# Patient Record
Sex: Female | Born: 1955 | Race: White | Hispanic: No | Smoking: Never smoker
Health system: Southern US, Community
[De-identification: ages and names within clinical notes are randomized; demographics above are authoritative.]

## PROBLEM LIST (undated history)

## (undated) DIAGNOSIS — F419 Anxiety disorder, unspecified: Secondary | ICD-10-CM

## (undated) DIAGNOSIS — M549 Dorsalgia, unspecified: Secondary | ICD-10-CM

## (undated) DIAGNOSIS — M1990 Unspecified osteoarthritis, unspecified site: Secondary | ICD-10-CM

## (undated) DIAGNOSIS — I10 Essential (primary) hypertension: Secondary | ICD-10-CM

## (undated) DIAGNOSIS — Z972 Presence of dental prosthetic device (complete) (partial): Secondary | ICD-10-CM

## (undated) DIAGNOSIS — G8929 Other chronic pain: Secondary | ICD-10-CM

## (undated) HISTORY — PX: BREAST EXCISIONAL BIOPSY: SUR124

## (undated) HISTORY — PX: OTHER SURGICAL HISTORY: SHX169

## (undated) HISTORY — PX: BREAST BIOPSY: SHX20

## (undated) HISTORY — PX: ABDOMINAL HYSTERECTOMY: SHX81

## (undated) HISTORY — PX: BACK SURGERY: SHX140

---

## 2004-05-19 ENCOUNTER — Ambulatory Visit: Payer: Self-pay | Admitting: Obstetrics and Gynecology

## 2004-05-31 ENCOUNTER — Ambulatory Visit: Payer: Self-pay | Admitting: Obstetrics and Gynecology

## 2005-01-05 ENCOUNTER — Ambulatory Visit: Payer: Self-pay | Admitting: General Surgery

## 2006-02-28 ENCOUNTER — Ambulatory Visit: Payer: Self-pay | Admitting: Family Medicine

## 2007-03-13 ENCOUNTER — Ambulatory Visit: Payer: Self-pay | Admitting: Family Medicine

## 2007-10-08 ENCOUNTER — Other Ambulatory Visit: Payer: Self-pay

## 2007-10-08 ENCOUNTER — Emergency Department: Payer: Self-pay | Admitting: Emergency Medicine

## 2007-10-09 ENCOUNTER — Ambulatory Visit: Payer: Self-pay | Admitting: Cardiology

## 2008-05-08 ENCOUNTER — Ambulatory Visit: Payer: Self-pay | Admitting: Obstetrics & Gynecology

## 2009-05-29 ENCOUNTER — Ambulatory Visit: Payer: Self-pay | Admitting: Obstetrics & Gynecology

## 2010-07-07 ENCOUNTER — Ambulatory Visit: Payer: Self-pay | Admitting: Obstetrics & Gynecology

## 2010-08-08 ENCOUNTER — Emergency Department (HOSPITAL_COMMUNITY)
Admission: EM | Admit: 2010-08-08 | Discharge: 2010-08-08 | Disposition: A | Discharge status: Home / Self Care | Payer: Medicare Other | Attending: Emergency Medicine | Admitting: Emergency Medicine

## 2010-08-08 DIAGNOSIS — R300 Dysuria: Secondary | ICD-10-CM | POA: Insufficient documentation

## 2010-08-08 DIAGNOSIS — R319 Hematuria, unspecified: Secondary | ICD-10-CM | POA: Insufficient documentation

## 2010-08-08 DIAGNOSIS — N390 Urinary tract infection, site not specified: Secondary | ICD-10-CM | POA: Insufficient documentation

## 2010-08-08 DIAGNOSIS — R350 Frequency of micturition: Secondary | ICD-10-CM | POA: Insufficient documentation

## 2010-08-08 DIAGNOSIS — R3915 Urgency of urination: Secondary | ICD-10-CM | POA: Insufficient documentation

## 2010-08-08 LAB — URINALYSIS, ROUTINE W REFLEX MICROSCOPIC
Ketones, ur: 15 mg/dL — AB
Nitrite: POSITIVE — AB
Urobilinogen, UA: 2 mg/dL — ABNORMAL HIGH (ref 0.0–1.0)
pH: 5 (ref 5.0–8.0)

## 2010-08-08 LAB — URINE MICROSCOPIC-ADD ON

## 2010-08-09 LAB — URINE CULTURE
Colony Count: 90000
Culture  Setup Time: 201205070011

## 2010-08-12 ENCOUNTER — Emergency Department: Payer: Self-pay | Admitting: Emergency Medicine

## 2010-08-16 ENCOUNTER — Emergency Department (HOSPITAL_COMMUNITY)
Admission: EM | Admit: 2010-08-16 | Discharge: 2010-08-16 | Disposition: A | Discharge status: Home / Self Care | Payer: Medicare Other | Attending: Emergency Medicine | Admitting: Emergency Medicine

## 2010-08-16 DIAGNOSIS — N899 Noninflammatory disorder of vagina, unspecified: Secondary | ICD-10-CM | POA: Insufficient documentation

## 2010-08-16 DIAGNOSIS — N760 Acute vaginitis: Secondary | ICD-10-CM | POA: Insufficient documentation

## 2010-08-16 DIAGNOSIS — R51 Headache: Secondary | ICD-10-CM | POA: Insufficient documentation

## 2010-08-16 DIAGNOSIS — Z79899 Other long term (current) drug therapy: Secondary | ICD-10-CM | POA: Insufficient documentation

## 2010-08-16 DIAGNOSIS — R112 Nausea with vomiting, unspecified: Secondary | ICD-10-CM | POA: Insufficient documentation

## 2010-08-16 DIAGNOSIS — N3090 Cystitis, unspecified without hematuria: Secondary | ICD-10-CM | POA: Insufficient documentation

## 2010-08-16 LAB — URINALYSIS, ROUTINE W REFLEX MICROSCOPIC
Ketones, ur: NEGATIVE mg/dL
Nitrite: POSITIVE — AB
Protein, ur: NEGATIVE mg/dL
Urobilinogen, UA: 1 mg/dL (ref 0.0–1.0)
pH: 6 (ref 5.0–8.0)

## 2010-08-16 LAB — DIFFERENTIAL
Basophils Absolute: 0.1 10*3/uL (ref 0.0–0.1)
Eosinophils Relative: 1 % (ref 0–5)
Lymphocytes Relative: 29 % (ref 12–46)
Lymphs Abs: 2.7 10*3/uL (ref 0.7–4.0)
Monocytes Relative: 10 % (ref 3–12)
Neutro Abs: 5.5 10*3/uL (ref 1.7–7.7)

## 2010-08-16 LAB — POCT I-STAT, CHEM 8
BUN: 7 mg/dL (ref 6–23)
Chloride: 95 mEq/L — ABNORMAL LOW (ref 96–112)
Creatinine, Ser: 0.8 mg/dL (ref 0.4–1.2)
Glucose, Bld: 108 mg/dL — ABNORMAL HIGH (ref 70–99)
Potassium: 3 mEq/L — ABNORMAL LOW (ref 3.5–5.1)
Sodium: 138 mEq/L (ref 135–145)

## 2010-08-16 LAB — WET PREP, GENITAL: Clue Cells Wet Prep HPF POC: NONE SEEN

## 2010-08-16 LAB — CBC
HCT: 36.1 % (ref 36.0–46.0)
Hemoglobin: 12.4 g/dL (ref 12.0–15.0)
MCH: 29.8 pg (ref 26.0–34.0)
MCHC: 34.3 g/dL (ref 30.0–36.0)
RBC: 4.16 MIL/uL (ref 3.87–5.11)

## 2010-08-17 LAB — URINE CULTURE
Colony Count: NO GROWTH
Culture: NO GROWTH

## 2011-12-04 ENCOUNTER — Emergency Department (HOSPITAL_COMMUNITY)
Admission: EM | Admit: 2011-12-04 | Discharge: 2011-12-04 | Disposition: A | Discharge status: Home / Self Care | Payer: Medicare Other | Attending: Emergency Medicine | Admitting: Emergency Medicine

## 2011-12-04 ENCOUNTER — Encounter (HOSPITAL_COMMUNITY): Payer: Self-pay | Admitting: Nurse Practitioner

## 2011-12-04 DIAGNOSIS — B349 Viral infection, unspecified: Secondary | ICD-10-CM

## 2011-12-04 DIAGNOSIS — R110 Nausea: Secondary | ICD-10-CM | POA: Insufficient documentation

## 2011-12-04 DIAGNOSIS — R51 Headache: Secondary | ICD-10-CM | POA: Insufficient documentation

## 2011-12-04 DIAGNOSIS — B9789 Other viral agents as the cause of diseases classified elsewhere: Secondary | ICD-10-CM | POA: Insufficient documentation

## 2011-12-04 HISTORY — DX: Other chronic pain: G89.29

## 2011-12-04 HISTORY — DX: Dorsalgia, unspecified: M54.9

## 2011-12-04 LAB — CBC WITH DIFFERENTIAL/PLATELET
Basophils Absolute: 0 K/uL (ref 0.0–0.1)
Basophils Relative: 0 % (ref 0–1)
Eosinophils Absolute: 0.1 K/uL (ref 0.0–0.7)
Eosinophils Relative: 1 % (ref 0–5)
HCT: 37.7 % (ref 36.0–46.0)
Hemoglobin: 13.3 g/dL (ref 12.0–15.0)
Lymphocytes Relative: 31 % (ref 12–46)
Lymphs Abs: 2.2 K/uL (ref 0.7–4.0)
MCH: 30.7 pg (ref 26.0–34.0)
MCHC: 35.3 g/dL (ref 30.0–36.0)
MCV: 87.1 fL (ref 78.0–100.0)
Monocytes Absolute: 0.8 K/uL (ref 0.1–1.0)
Monocytes Relative: 12 % (ref 3–12)
Neutro Abs: 3.8 K/uL (ref 1.7–7.7)
Neutrophils Relative %: 55 % (ref 43–77)
Platelets: 238 K/uL (ref 150–400)
RBC: 4.33 MIL/uL (ref 3.87–5.11)
RDW: 12.2 % (ref 11.5–15.5)
WBC: 7 K/uL (ref 4.0–10.5)

## 2011-12-04 LAB — URINALYSIS, ROUTINE W REFLEX MICROSCOPIC
Bilirubin Urine: NEGATIVE
Glucose, UA: NEGATIVE mg/dL
Ketones, ur: NEGATIVE mg/dL
Nitrite: NEGATIVE
Protein, ur: NEGATIVE mg/dL
Specific Gravity, Urine: 1.021 (ref 1.005–1.030)
Urobilinogen, UA: 0.2 mg/dL (ref 0.0–1.0)
pH: 5.5 (ref 5.0–8.0)

## 2011-12-04 LAB — BASIC METABOLIC PANEL WITH GFR
BUN: 8 mg/dL (ref 6–23)
CO2: 30 meq/L (ref 19–32)
Calcium: 9.4 mg/dL (ref 8.4–10.5)
Chloride: 102 meq/L (ref 96–112)
Creatinine, Ser: 0.78 mg/dL (ref 0.50–1.10)
GFR calc Af Amer: >90 mL/min
GFR calc non Af Amer: >90 mL/min
Glucose, Bld: 107 mg/dL — ABNORMAL HIGH (ref 70–99)
Potassium: 3.5 meq/L (ref 3.5–5.1)
Sodium: 139 meq/L (ref 135–145)

## 2011-12-04 LAB — URINE MICROSCOPIC-ADD ON

## 2011-12-04 MED ORDER — IBUPROFEN 400 MG PO TABS: 400.0000 mg | ORAL_TABLET | Freq: Four times a day (QID) | ORAL | Status: AC | PRN

## 2011-12-04 NOTE — ED Provider Notes (Addendum)
History   This chart was scribed for Derwood Kaplan, MD by Melba Coon. The patient was seen in room TR04C/TR04C and the patient's care was started at 12:57PM.    CSN: [id number]  Arrival date & time 12/04/11  1131   First MD Initiated Contact with Patient 12/04/11 1247      Chief Complaint  Patient presents with  . Headache    (Consider location/radiation/quality/duration/timing/severity/associated sxs/prior treatment) The history is provided by the patient. No language interpreter was used.   Sylvia Cole is a 56 y.o. female who presents to the Emergency Department complaining of constant, moderate to severe headache with associated nausea and fever around 100 with an onset 2:00AM 2 days ago. Pt states that the HA woke her up in her sleep. Left sided back pain has also started; pt takes methadone 30mg /day which alleviates the back pain. No BM in the last 2 days. Decreased appetite compared to baseline. No tick bites or outdoor activities. Baseline right-sided back pain present. Malaise and fatigue present. No neck pain, sore throat, rash, CP, SOB, abd pain, vomit, diarrhea, dysuria, urinary or bowel dysfunction, or extremity edema, weakness, numbness, or tingling. No other pertinent medical symptoms.   Past Medical History  Diagnosis Date  . Chronic back pain     Past Surgical History  Procedure Date  . Back surgery   . Abdominal hysterectomy     History reviewed. No pertinent family history.  History  Substance Use Topics  . Smoking status: Never Smoker   . Smokeless tobacco: Not on file  . Alcohol Use: Yes     rare    OB History    Grav Para Term Preterm Abortions TAB SAB Ect Mult Living                  Review of Systems 10 Systems reviewed and all are negative for acute change except as noted in the HPI.   Allergies  Bactrim; Ciprofloxacin; and Celebrex  Home Medications   Current Outpatient Rx  Name Route Sig Dispense Refill  . METHADONE HCL 5  MG PO TABS Oral Take 7.5 mg by mouth 4 (four) times daily.      BP 134/76  Pulse 64  Temp 98.8 F (37.1 C) (Oral)  Resp 15  SpO2 96%  Physical Exam  Nursing note and vitals reviewed. Constitutional: She is oriented to person, place, and time. She appears well-developed and well-nourished.  HENT:  Head: Normocephalic and atraumatic.  Eyes: EOM are normal. Pupils are equal, round, and reactive to light.       Pupils are 2mm equally and no nystagmus.  Neck: Normal range of motion. Neck supple.  Cardiovascular: Normal rate, normal heart sounds and intact distal pulses.   No murmur heard. Pulmonary/Chest: Effort normal and breath sounds normal. No respiratory distress. She has no wheezes.  Abdominal: Soft. Bowel sounds are normal. She exhibits no distension. There is no tenderness.  Musculoskeletal: Normal range of motion. She exhibits tenderness (right CVA tenderness that is reproducible with palpation that radiates to the hip). She exhibits no edema.  Lymphadenopathy:    She has no cervical adenopathy.  Neurological: She is alert and oriented to person, place, and time. She has normal strength. No cranial nerve deficit or sensory deficit.       Cranial nerves II-XII intact with no meningeal signs.  Skin: Skin is warm and dry. No rash noted.  Psychiatric: She has a normal mood and affect.    ED  Course  Procedures (including critical care time)  DIAGNOSTIC STUDIES: Oxygen Saturation is 96% on room air, adequate by my interpretation.    COORDINATION OF CARE:  1:04PM - blood w/u and UA will be ordered for the pt.   Labs Reviewed  BASIC METABOLIC PANEL - Abnormal; Notable for the following:    Glucose, Bld 107 (*)     All other components within normal limits  URINALYSIS, ROUTINE W REFLEX MICROSCOPIC - Abnormal; Notable for the following:    Hgb urine dipstick TRACE (*)     Leukocytes, UA TRACE (*)     All other components within normal limits  CBC WITH DIFFERENTIAL  URINE  MICROSCOPIC-ADD ON   No results found.   No diagnosis found.    MDM  56 y/o healthy woman comes in with cc of maxillary headaches, myalgias, malaise, back pain, low grade fevers No UTI like sx. + nausea, but no vomiting, visual complains, seizures, altered mental status, loss of consciousness, new weakness, or numbness, no gait instability. No tick bites, no exposures to wilderness.  Likely viral syndrome. Will get basic labs only.   Medical screening examination/treatment/procedure(s) were performed by me as the supervising physician. Scribe service was utilized for documentation only.     Derwood Kaplan, MD 12/04/11 1440  Derwood Kaplan, MD 12/04/11 1440

## 2011-12-04 NOTE — ED Notes (Signed)
C/o fatigue, headache, nausea and low fever since friday

## 2012-01-31 ENCOUNTER — Emergency Department: Payer: Self-pay | Admitting: Emergency Medicine

## 2015-02-12 ENCOUNTER — Encounter: Payer: Self-pay | Admitting: General Surgery

## 2015-02-12 ENCOUNTER — Ambulatory Visit (INDEPENDENT_AMBULATORY_CARE_PROVIDER_SITE_OTHER): Payer: Medicare Other | Admitting: General Surgery

## 2015-02-12 VITALS — BP 130/74 | HR 76 | Resp 12 | Ht <= 58 in | Wt 147.0 lb

## 2015-02-12 DIAGNOSIS — Z1211 Encounter for screening for malignant neoplasm of colon: Secondary | ICD-10-CM | POA: Diagnosis not present

## 2015-02-12 MED ORDER — POLYETHYLENE GLYCOL 3350 17 GM/SCOOP PO POWD: ORAL | Status: DC

## 2015-02-12 MED ORDER — METOCLOPRAMIDE HCL 10 MG PO TABS: 10.0000 mg | ORAL_TABLET | ORAL | Status: DC

## 2015-02-12 NOTE — Progress Notes (Signed)
Patient ID: Sylvia Cole, female   DOB: 1955/12/29, 59 y.o.   MRN: [id number]  Chief Complaint  Patient presents with  . Colonoscopy    HPI Sylvia Cole is a 59 y.o. female here today for a evaluation of a screening colonoscopy. No GI problems at this time. Patient states her husband has gone on to develop stage IV colon cancer. Patient moves her bowls every other day. Chronic constipation secondary to necrotic use for back pain. No history of change in bowel habits, blood or mucus per rectum. HPI  Past Medical History  Diagnosis Date  . Chronic back pain     Past Surgical History  Procedure Laterality Date  . Back surgery    . Abdominal hysterectomy      Family History  Problem Relation Age of Onset  . Breast cancer Mother     Social History Social History  Substance Use Topics  . Smoking status: Never Smoker   . Smokeless tobacco: None  . Alcohol Use: Yes     Comment: rare    Allergies  Allergen Reactions  . Bactrim [Sulfamethoxazole-Trimethoprim] Other (See Comments)    headache  . Ciprofloxacin Other (See Comments)    headache  . Celebrex [Celecoxib] Rash    Current Outpatient Prescriptions  Medication Sig Dispense Refill  . cyclobenzaprine (FLEXERIL) 10 MG tablet Take 10 mg by mouth 3 (three) times daily.     . methadone (DOLOPHINE) 5 MG tablet Take 5 mg by mouth daily.     . sertraline (ZOLOFT) 50 MG tablet Take 50 mg by mouth daily.     . traZODone (DESYREL) 50 MG tablet Take 50 mg by mouth at bedtime.    Marland Kitchen venlafaxine XR (EFFEXOR-XR) 75 MG 24 hr capsule Take 75 mg by mouth daily with breakfast.     . metoCLOPramide (REGLAN) 10 MG tablet Take 1 tablet (10 mg total) by mouth as directed. 1 tabs 30 minutes before beginning prep and 4 hr later if needed for nausea. 2 tablet 0  . polyethylene glycol powder (GLYCOLAX/MIRALAX) powder 255 grams one bottle for colonoscopy prep 255 g 0   No current facility-administered medications for this visit.    Review  of Systems Review of Systems  Constitutional: Negative.   Respiratory: Negative.   Cardiovascular: Negative.     Blood pressure 130/74, pulse 76, resp. rate 12, height 4\' 10"  (1.473 m), weight 147 lb (66.679 kg).  Physical Exam Physical Exam  Constitutional: She is oriented to person, place, and time. She appears well-developed and well-nourished.  HENT:  Mouth/Throat: Oropharynx is clear and moist.  Eyes: Conjunctivae are normal. No scleral icterus.  Neck: Neck supple.  Cardiovascular: Normal rate, regular rhythm and normal heart sounds.   Pulmonary/Chest: Effort normal and breath sounds normal.  Lymphadenopathy:    She has no cervical adenopathy.  Neurological: She is alert and oriented to person, place, and time.  Skin: Skin is warm and dry.  Psychiatric: Her behavior is normal.    Data Reviewed  PCP notes dated 01/07/2015 note patient made that she had been referred for colonoscopy several times.  Assessment    Candidate for screening colonoscopy.     Plan    The patient was very concerned about being nauseated from the prep. Emphasized that she can choose what she would like to mix the MiraLAX in, and this should make the nausea much less troublesome. We'll have her make use of Reglan 30 minutes before and 4 hours later if  needed to minimize nausea.     Her husband suffered an intracranial bleed after a fall and while these may good recover from a stroke he has no seizures. She may need to cancel if he is hospitalized.  Colonoscopy with possible biopsy/polypectomy prn: Information regarding the procedure, including its potential risks and complications (including but not limited to perforation of the bowel, which may require emergency surgery to repair, and bleeding) was verbally given to the patient. Educational information regarding lower intestinal endoscopy was given to the patient. Written instructions for how to complete the bowel prep using Miralax were provided.  The importance of drinking ample fluids to avoid dehydration as a result of the prep emphasized.  Patient has been scheduled for a colonoscopy on 04-14-15 at [facility].  PCP:  Timoteo Gaul 02/13/2015, 5:35 AM

## 2015-02-12 NOTE — Patient Instructions (Addendum)
Colonoscopy A colonoscopy is an exam to look at the entire large intestine (colon). This exam can help find problems such as tumors, polyps, inflammation, and areas of bleeding. The exam takes about 1 hour.  LET [facility] CARE PROVIDER KNOW ABOUT:   Any allergies you have.  All medicines you are taking, including vitamins, herbs, eye drops, creams, and over-the-counter medicines.  Previous problems you or members of your family have had with the use of anesthetics.  Any blood disorders you have.  Previous surgeries you have had.  Medical conditions you have. RISKS AND COMPLICATIONS  Generally, this is a safe procedure. However, as with any procedure, complications can occur. Possible complications include:  Bleeding.  Tearing or rupture of the colon wall.  Reaction to medicines given during the exam.  Infection (rare). BEFORE THE PROCEDURE   Ask your health care provider about changing or stopping your regular medicines.  You may be prescribed an oral bowel prep. This involves drinking a large amount of medicated liquid, starting the day before your procedure. The liquid will cause you to have multiple loose stools until your stool is almost clear or light green. This cleans out your colon in preparation for the procedure.  Do not eat or drink anything else once you have started the bowel prep, unless your health care provider tells you it is safe to do so.  Arrange for someone to drive you home after the procedure. PROCEDURE   You will be given medicine to help you relax (sedative).  You will lie on your side with your knees bent.  A long, flexible tube with a light and camera on the end (colonoscope) will be inserted through the rectum and into the colon. The camera sends video back to a computer screen as it moves through the colon. The colonoscope also releases carbon dioxide gas to inflate the colon. This helps your health care provider see the area better.  During  the exam, your health care provider may take a small tissue sample (biopsy) to be examined under a microscope if any abnormalities are found.  The exam is finished when the entire colon has been viewed. AFTER THE PROCEDURE   Do not drive for 24 hours after the exam.  You may have a small amount of blood in your stool.  You may pass moderate amounts of gas and have mild abdominal cramping or bloating. This is caused by the gas used to inflate your colon during the exam.  Ask when your test results will be ready and how you will get your results. Make sure you get your test results.   This information is not intended to replace advice given to you by your health care provider. Make sure you discuss any questions you have with your health care provider.   Document Released: 03/18/2000 Document Revised: 01/09/2013 Document Reviewed: 11/26/2012 Elsevier Interactive Patient Education [employer].  Patient has been scheduled for a colonoscopy on 04-14-15 at [facility].

## 2015-02-13 ENCOUNTER — Other Ambulatory Visit: Payer: Self-pay | Admitting: General Surgery

## 2015-02-13 DIAGNOSIS — Z1211 Encounter for screening for malignant neoplasm of colon: Secondary | ICD-10-CM

## 2015-04-08 ENCOUNTER — Telehealth: Payer: Self-pay | Admitting: *Deleted

## 2015-04-08 NOTE — Telephone Encounter (Signed)
Patient contacted today and confirms no medication changes since last office visit. She also reports she has picked up Miralax prescription.  We will proceed with colonoscopy as scheduled for 04-14-15 at [facility].  This patient was instructed to call the office should she have further questions.

## 2015-04-14 ENCOUNTER — Encounter
Admission: RE | Disposition: A | Discharge status: Home / Self Care | Payer: Self-pay | Source: Ambulatory Visit | Attending: General Surgery

## 2015-04-14 ENCOUNTER — Ambulatory Visit
Admission: RE | Admit: 2015-04-14 | Discharge: 2015-04-14 | Disposition: A | Discharge status: Home / Self Care | Payer: Medicare Other | Source: Ambulatory Visit | Attending: General Surgery | Admitting: General Surgery

## 2015-04-14 ENCOUNTER — Ambulatory Visit: Payer: Medicare Other | Admitting: Anesthesiology

## 2015-04-14 ENCOUNTER — Encounter: Payer: Self-pay | Admitting: *Deleted

## 2015-04-14 DIAGNOSIS — M549 Dorsalgia, unspecified: Secondary | ICD-10-CM | POA: Insufficient documentation

## 2015-04-14 DIAGNOSIS — Z882 Allergy status to sulfonamides status: Secondary | ICD-10-CM | POA: Diagnosis not present

## 2015-04-14 DIAGNOSIS — Z881 Allergy status to other antibiotic agents status: Secondary | ICD-10-CM | POA: Insufficient documentation

## 2015-04-14 DIAGNOSIS — G8929 Other chronic pain: Secondary | ICD-10-CM | POA: Insufficient documentation

## 2015-04-14 DIAGNOSIS — Z79899 Other long term (current) drug therapy: Secondary | ICD-10-CM | POA: Insufficient documentation

## 2015-04-14 DIAGNOSIS — M1990 Unspecified osteoarthritis, unspecified site: Secondary | ICD-10-CM | POA: Insufficient documentation

## 2015-04-14 DIAGNOSIS — Z1211 Encounter for screening for malignant neoplasm of colon: Secondary | ICD-10-CM

## 2015-04-14 DIAGNOSIS — Z886 Allergy status to analgesic agent status: Secondary | ICD-10-CM | POA: Diagnosis not present

## 2015-04-14 DIAGNOSIS — F419 Anxiety disorder, unspecified: Secondary | ICD-10-CM | POA: Insufficient documentation

## 2015-04-14 HISTORY — DX: Anxiety disorder, unspecified: F41.9

## 2015-04-14 HISTORY — PX: COLONOSCOPY WITH PROPOFOL: SHX5780

## 2015-04-14 HISTORY — DX: Unspecified osteoarthritis, unspecified site: M19.90

## 2015-04-14 SURGERY — COLONOSCOPY WITH PROPOFOL
Anesthesia: General

## 2015-04-14 MED ORDER — MIDAZOLAM HCL 2 MG/2ML IJ SOLN
INTRAMUSCULAR | Status: DC | PRN
  Administered 2015-04-14: 2 mg via INTRAVENOUS

## 2015-04-14 MED ORDER — SODIUM CHLORIDE 0.9 % IV SOLN
INTRAVENOUS | Status: DC
  Administered 2015-04-14: 14:00:00 via INTRAVENOUS

## 2015-04-14 MED ORDER — FENTANYL CITRATE (PF) 100 MCG/2ML IJ SOLN
INTRAMUSCULAR | Status: DC | PRN
  Administered 2015-04-14 (×2): 50 ug via INTRAVENOUS

## 2015-04-14 MED ORDER — PHENYLEPHRINE HCL 10 MG/ML IJ SOLN
INTRAMUSCULAR | Status: DC | PRN
  Administered 2015-04-14: 100 ug via INTRAVENOUS

## 2015-04-14 MED ORDER — PROPOFOL 500 MG/50ML IV EMUL
INTRAVENOUS | Status: DC | PRN
  Administered 2015-04-14: 100 ug/kg/min via INTRAVENOUS

## 2015-04-14 MED ORDER — PROPOFOL 10 MG/ML IV BOLUS
INTRAVENOUS | Status: DC | PRN
  Administered 2015-04-14: 100 mg via INTRAVENOUS

## 2015-04-14 NOTE — H&P (Signed)
ANITTA SENICK [id number] 12/21/1955     HPI: Candidate for screening colonoscopy.   Prescriptions prior to admission  Medication Sig Dispense Refill Last Dose  . cyclobenzaprine (FLEXERIL) 10 MG tablet Take 10 mg by mouth 3 (three) times daily.    04/14/2015 at Unknown time  . methadone (DOLOPHINE) 5 MG tablet Take 5 mg by mouth daily.    04/14/2015 at Unknown time  . sertraline (ZOLOFT) 50 MG tablet Take 50 mg by mouth daily.    04/14/2015 at Unknown time  . metoCLOPramide (REGLAN) 10 MG tablet Take 1 tablet (10 mg total) by mouth as directed. 1 tabs 30 minutes before beginning prep and 4 hr later if needed for nausea. 2 tablet 0   . polyethylene glycol powder (GLYCOLAX/MIRALAX) powder 255 grams one bottle for colonoscopy prep 255 g 0   . traZODone (DESYREL) 50 MG tablet Take 50 mg by mouth at bedtime.   Taking  . venlafaxine XR (EFFEXOR-XR) 75 MG 24 hr capsule Take 75 mg by mouth daily with breakfast.    Taking   Allergies  Allergen Reactions  . Bactrim [Sulfamethoxazole-Trimethoprim] Other (See Comments)    headache  . Ciprofloxacin Other (See Comments)    headache  . Celebrex [Celecoxib] Rash   Past Medical History  Diagnosis Date  . Chronic back pain   . Anxiety   . Arthritis    Past Surgical History  Procedure Laterality Date  . Back surgery    . Abdominal hysterectomy     Social History   Social History  . Marital Status: Married    Spouse Name: N/A  . Number of Children: N/A  . Years of Education: N/A   Occupational History  . Not on file.   Social History Main Topics  . Smoking status: Never Smoker   . Smokeless tobacco: Not on file  . Alcohol Use: Yes     Comment: rare  . Drug Use: No  . Sexual Activity: Not on file   Other Topics Concern  . Not on file   Social History Narrative   Social History   Social History Narrative     ROS: Negative.     PE: HEENT: Negative. Lungs: Clear. Cardio: RR. Robert Bellow 04/14/2015   Assessment/Plan:  Proceed with planned endoscopy.

## 2015-04-14 NOTE — Anesthesia Postprocedure Evaluation (Signed)
Anesthesia Post Note  Patient: Sylvia Cole  Procedure(s) Performed: Procedure(s) (LRB): COLONOSCOPY WITH PROPOFOL (N/A)  Patient location during evaluation: Endoscopy Anesthesia Type: General Level of consciousness: awake and alert Pain management: pain level controlled Vital Signs Assessment: post-procedure vital signs reviewed and stable Respiratory status: spontaneous breathing, nonlabored ventilation, respiratory function stable and patient connected to nasal cannula oxygen Cardiovascular status: blood pressure returned to baseline and stable Postop Assessment: no signs of nausea or vomiting Anesthetic complications: no    Last Vitals:  Filed Vitals:   04/14/15 1507 04/14/15 1517  BP: 100/70 116/80  Pulse: 83 81  Temp:    Resp: 15 15    Last Pain: There were no vitals filed for this visit.               Precious Haws Piscitello

## 2015-04-14 NOTE — Op Note (Signed)
[facility] Patient Name: Sylvia Cole Procedure Date: 04/14/2015 2:10 PM MRN: [id number] Account #: [id number] Date of Birth: 1955/10/03 Admit Type: Outpatient Age: 60 Room: [facility] ENDO ROOM 4 Gender: Female Note Status: Finalized Procedure:         Colonoscopy Indications:       Screening for colorectal malignant neoplasm Providers:         Robert Bellow, MD Referring MD:      Youlanda Roys. Lovie Macadamia, MD (Referring MD) Medicines:         Monitored Anesthesia Care Complications:     No immediate complications. Procedure:         Pre-Anesthesia Assessment:                    - Prior to the procedure, a History and Physical was                     performed, and patient medications, allergies and                     sensitivities were reviewed. The patient's tolerance of                     previous anesthesia was reviewed.                    - The risks and benefits of the procedure and the sedation                     options and risks were discussed with the patient. All                     questions were answered and informed consent was obtained.                    After obtaining informed consent, the colonoscope was                     passed under direct vision. Throughout the procedure, the                     patient's blood pressure, pulse, and oxygen saturations                     were monitored continuously. The Colonoscope was                     introduced through the anus and advanced to the the cecum,                     identified by the appendiceal orifice, IC valve and                     transillumination. The colonoscopy was somewhat difficult                     due to significant looping in thee rectosigmoid.                     Successful completion of the procedure was aided by                     changing the patient to a supine position. The patient  tolerated the procedure well. The quality of the  bowel                     preparation was excellent. Findings:      The entire examined colon appeared normal on direct and retroflexion       views. Impression:        - The entire examined colon is normal on direct and                     retroflexion views.                    - No specimens collected. Recommendation:    - Discharge patient to home (via wheelchair).                    - Repeat colonoscopy in 10 years for screening purposes. Procedure Code(s): --- Professional ---                    [phone], Colonoscopy, flexible; diagnostic, including                     collection of specimen(s) by brushing or washing, when                     performed (separate procedure) Diagnosis Code(s): --- Professional ---                    Z12.11, Encounter for screening for malignant neoplasm of                     colon CPT copyright 2014 American Medical Association. All rights reserved. The codes documented in this report are preliminary and upon coder review may  be revised to meet current compliance requirements. Robert Bellow, MD 04/14/2015 2:45:13 PM This report has been signed electronically. Number of Addenda: 0 Note Initiated On: 04/14/2015 2:10 PM Scope Withdrawal Time: 0 hours 10 minutes 14 seconds  Total Procedure Duration: 0 hours 25 minutes 47 seconds       [facility]

## 2015-04-14 NOTE — Anesthesia Preprocedure Evaluation (Signed)
Anesthesia Evaluation  Patient identified by MRN, date of birth, ID band Patient awake    Reviewed: Allergy & Precautions, H&P , NPO status , Patient's Chart, lab work & pertinent test results  History of Anesthesia Complications Negative for: history of anesthetic complications  Airway Mallampati: III  TM Distance: >3 FB Neck ROM: limited    Dental no notable dental hx. (+) Poor Dentition, Chipped, Missing, Caps, Edentulous Lower   Pulmonary neg pulmonary ROS, neg shortness of breath,    Pulmonary exam normal breath sounds clear to auscultation       Cardiovascular Exercise Tolerance: Good (-) angina(-) Past MI and (-) DOE negative cardio ROS Normal cardiovascular exam Rhythm:regular Rate:Normal     Neuro/Psych PSYCHIATRIC DISORDERS Anxiety negative neurological ROS     GI/Hepatic negative GI ROS, Neg liver ROS, neg GERD  ,  Endo/Other  negative endocrine ROS  Renal/GU negative Renal ROS  negative genitourinary   Musculoskeletal  (+) Arthritis ,   Abdominal   Peds  Hematology negative hematology ROS (+)   Anesthesia Other Findings Past Medical History:   Chronic back pain                                            Anxiety                                                      Arthritis                                                   Past Surgical History:   BACK SURGERY                                                  ABDOMINAL HYSTERECTOMY                                       BMI    Body Mass Index   30.73 kg/m 2      Reproductive/Obstetrics negative OB ROS                             Anesthesia Physical Anesthesia Plan  ASA: III  Anesthesia Plan: General   Post-op Pain Management:    Induction:   Airway Management Planned:   Additional Equipment:   Intra-op Plan:   Post-operative Plan:   Informed Consent: I have reviewed the patients History and Physical,  chart, labs and discussed the procedure including the risks, benefits and alternatives for the proposed anesthesia with the patient or authorized representative who has indicated his/her understanding and acceptance.   Dental Advisory Given  Plan Discussed with: Anesthesiologist, CRNA and Surgeon  Anesthesia Plan Comments:         Anesthesia Quick Evaluation

## 2015-04-14 NOTE — Transfer of Care (Signed)
Immediate Anesthesia Transfer of Care Note  Patient: Sylvia Cole  Procedure(s) Performed: Procedure(s): COLONOSCOPY WITH PROPOFOL (N/A)  Patient Location: PACU  Anesthesia Type:General  Level of Consciousness: awake, alert  and oriented  Airway & Oxygen Therapy: Patient Spontanous Breathing and Patient connected to nasal cannula oxygen  Post-op Assessment: Report given to RN and Post -op Vital signs reviewed and stable  Post vital signs: Reviewed and stable  Last Vitals:  Filed Vitals:   04/14/15 1342  BP: 126/89  Pulse: 108  Temp: 37.6 C  Resp: 20    Complications: No apparent anesthesia complications

## 2015-04-16 ENCOUNTER — Encounter: Payer: Self-pay | Admitting: General Surgery

## 2015-07-14 ENCOUNTER — Encounter: Payer: Self-pay | Admitting: General Surgery

## 2015-10-16 ENCOUNTER — Encounter: Payer: Self-pay | Admitting: *Deleted

## 2015-10-19 ENCOUNTER — Encounter: Payer: Self-pay | Admitting: General Surgery

## 2015-10-19 ENCOUNTER — Ambulatory Visit (INDEPENDENT_AMBULATORY_CARE_PROVIDER_SITE_OTHER): Payer: Medicare Other | Admitting: General Surgery

## 2015-10-19 VITALS — BP 128/72 | Ht <= 58 in | Wt 134.0 lb

## 2015-10-19 DIAGNOSIS — N649 Disorder of breast, unspecified: Secondary | ICD-10-CM

## 2015-10-19 DIAGNOSIS — L988 Other specified disorders of the skin and subcutaneous tissue: Secondary | ICD-10-CM

## 2015-10-19 DIAGNOSIS — D235 Other benign neoplasm of skin of trunk: Secondary | ICD-10-CM

## 2015-10-19 NOTE — Patient Instructions (Signed)
Return one week nurse

## 2015-10-19 NOTE — Progress Notes (Signed)
Patient ID: Sylvia Cole, female   DOB: [birth year], 60 y.o.   MRN: [id number]  Chief Complaint  Patient presents with  . Other    right breast mole    HPI Sylvia Cole is a 60 y.o. female here for an evaluation of a mole on her right breast. She states that this came up two months ago. She reports that it got red looking last week, but now looks brown again. She reports no other breast problems.  HPI   Past Medical History  Diagnosis Date  . Chronic back pain   . Anxiety   . Arthritis     Past Surgical History  Procedure Laterality Date  . Back surgery    . Abdominal hysterectomy    . Colonoscopy with propofol N/A 04/14/2015    Procedure: COLONOSCOPY WITH PROPOFOL;  Surgeon: Robert Bellow, MD;  Location: [facility] ENDOSCOPY;  Service: Endoscopy;  Laterality: N/A;    Family History  Problem Relation Age of Onset  . Breast cancer Mother   . Breast cancer Paternal Aunt   . Breast cancer Paternal Aunt     Social History Social History  Substance Use Topics  . Smoking status: Never Smoker   . Smokeless tobacco: Never Used  . Alcohol Use: 0.0 oz/week    0 Standard drinks or equivalent per week     Comment: rare    Allergies  Allergen Reactions  . Bactrim [Sulfamethoxazole-Trimethoprim] Other (See Comments)    headache  . Ciprofloxacin Other (See Comments)    headache  . Celebrex [Celecoxib] Rash    Current Outpatient Prescriptions  Medication Sig Dispense Refill  . cyclobenzaprine (FLEXERIL) 10 MG tablet Take 10 mg by mouth 3 (three) times daily.     . methadone (DOLOPHINE) 5 MG tablet Take 5 mg by mouth daily.     . metoCLOPramide (REGLAN) 10 MG tablet Take 1 tablet (10 mg total) by mouth as directed. 1 tabs 30 minutes before beginning prep and 4 hr later if needed for nausea. 2 tablet 0  . traZODone (DESYREL) 50 MG tablet Take 50 mg by mouth at bedtime.    Marland Kitchen venlafaxine XR (EFFEXOR-XR) 75 MG 24 hr capsule Take 75 mg by mouth daily with breakfast.      No  current facility-administered medications for this visit.    Review of Systems Review of Systems  Constitutional: Negative.   Respiratory: Negative.   Cardiovascular: Negative.     Blood pressure 128/72, height 4\' 10"  (1.473 m), weight 134 lb (60.782 kg).  Physical Exam Physical Exam  Constitutional: She is oriented to person, place, and time. She appears well-developed and well-nourished.  Eyes: Conjunctivae are normal. No scleral icterus.  Neck: Neck supple.  Pulmonary/Chest:    Right breast 1 cm brown shaped area.  Lymphadenopathy:    She has no cervical adenopathy.  Neurological: She is alert and oriented to person, place, and time.  Skin: Skin is warm and dry.      Assessment    Recently noted new skin lesion involving the lower outer quadrant of the right breast.    Plan    Options for management were reviewed: 1) dermatology evaluation and likely treatment with liquid notch and versus 2) sees excision.  The patient was anxious to have an answer, especially with the recent death of her husband for metastatic colon cancer. It was elected to proceed to excision.  10 mL of 0.5% Xylocaine with 0.25% Marcaine with 1-200,000 of epinephrine was utilized  well tolerated. ChloraPrep was applied to the skin. The lesion was excised in elliptical incision transversely oriented. A suture was placed the 12:00 position for orientation. The skin defect was closed with a running 4-0 Prolene suture. Telfa and Tegaderm dressing applied. Procedure was well tolerated. Patient will make use of OTC medications for pain if needed.      Patient to return in one week nurse. PCP:  Lovie Macadamia This information has been scribed by Gaspar Cola CMA.    Robert Bellow 10/20/2015, 12:07 PM

## 2015-10-20 DIAGNOSIS — L988 Other specified disorders of the skin and subcutaneous tissue: Secondary | ICD-10-CM | POA: Insufficient documentation

## 2015-10-20 DIAGNOSIS — N649 Disorder of breast, unspecified: Secondary | ICD-10-CM | POA: Insufficient documentation

## 2015-10-22 ENCOUNTER — Telehealth: Payer: Self-pay | Admitting: *Deleted

## 2015-10-22 NOTE — Telephone Encounter (Signed)
Notified patient as instructed, patient pleased. Discussed follow-up appointments, patient agrees  

## 2015-10-22 NOTE — Telephone Encounter (Signed)
-----   Message from Robert Bellow, MD sent at 10/22/2015 10:24 AM EDT ----- Please notify the patient that the pathology was entirely benign. Follow up for suture removal as planned. ----- Message -----    From: Lab in [facility] Interface    Sent: 10/21/2015  12:17 PM      To: Robert Bellow, MD

## 2015-10-27 ENCOUNTER — Ambulatory Visit (INDEPENDENT_AMBULATORY_CARE_PROVIDER_SITE_OTHER): Payer: Medicare Other | Admitting: *Deleted

## 2015-10-27 DIAGNOSIS — L988 Other specified disorders of the skin and subcutaneous tissue: Secondary | ICD-10-CM

## 2015-10-27 DIAGNOSIS — N649 Disorder of breast, unspecified: Secondary | ICD-10-CM

## 2015-10-27 NOTE — Progress Notes (Signed)
Patient came in today for a wound check.  The wound is clean, with no signs of infection noted.The sutures were removed and steri strips applied. Follow up as scheduled.  

## 2015-10-27 NOTE — Patient Instructions (Signed)
Patient to return as scheduled.  

## 2015-12-13 ENCOUNTER — Emergency Department
Admission: EM | Admit: 2015-12-13 | Discharge: 2015-12-13 | Disposition: A | Discharge status: Home / Self Care | Payer: Medicare Other | Attending: Emergency Medicine | Admitting: Emergency Medicine

## 2015-12-13 ENCOUNTER — Encounter: Payer: Self-pay | Admitting: Emergency Medicine

## 2015-12-13 DIAGNOSIS — Z79899 Other long term (current) drug therapy: Secondary | ICD-10-CM | POA: Insufficient documentation

## 2015-12-13 DIAGNOSIS — Y999 Unspecified external cause status: Secondary | ICD-10-CM | POA: Insufficient documentation

## 2015-12-13 DIAGNOSIS — Y929 Unspecified place or not applicable: Secondary | ICD-10-CM | POA: Diagnosis not present

## 2015-12-13 DIAGNOSIS — Z23 Encounter for immunization: Secondary | ICD-10-CM | POA: Diagnosis not present

## 2015-12-13 DIAGNOSIS — W260XXA Contact with knife, initial encounter: Secondary | ICD-10-CM | POA: Diagnosis not present

## 2015-12-13 DIAGNOSIS — S61219A Laceration without foreign body of unspecified finger without damage to nail, initial encounter: Secondary | ICD-10-CM

## 2015-12-13 DIAGNOSIS — S61213A Laceration without foreign body of left middle finger without damage to nail, initial encounter: Secondary | ICD-10-CM | POA: Diagnosis present

## 2015-12-13 DIAGNOSIS — Y9389 Activity, other specified: Secondary | ICD-10-CM | POA: Insufficient documentation

## 2015-12-13 MED ORDER — TETANUS-DIPHTH-ACELL PERTUSSIS 5-2.5-18.5 LF-MCG/0.5 IM SUSP
0.5000 mL | Freq: Once | INTRAMUSCULAR | Status: AC
  Administered 2015-12-13: 0.5 mL via INTRAMUSCULAR
  Filled 2015-12-13: qty 0.5

## 2015-12-13 NOTE — ED Triage Notes (Signed)
Pt says she was cleaning our her deceased husband's belongings from a closet and found a knife; was trying to open it and cut her left 3rd digit; pt says the laceration is small but she couldn't get it to close and stop bleeding; currently dressed with pressure dressing in triage; pt says her finger is throbbing

## 2015-12-13 NOTE — ED Provider Notes (Signed)
[facility] Emergency Department Provider Note  ____________________________________________  Time seen: Approximately 4:55 AM  I have reviewed the triage vital signs and the nursing notes.   HISTORY  Chief Complaint Laceration    HPI Sylvia Cole is a 60 y.o. female reports that she was pulling a knife out of a toolbox tonight, didn't realize there was an exposed blade, and cut her finger.Care remember last time she had a tetanus shot, doesn't think it was within last 5 years. No loss of sensation. It bled for about an hour but has since stopped bleeding. No other injuries.     Past Medical History:  Diagnosis Date  . Anxiety   . Arthritis   . Chronic back pain      Patient Active Problem List   Diagnosis Date Noted  . Skin lesion of breast 10/20/2015     Past Surgical History:  Procedure Laterality Date  . ABDOMINAL HYSTERECTOMY    . BACK SURGERY    . COLONOSCOPY WITH PROPOFOL N/A 04/14/2015   Procedure: COLONOSCOPY WITH PROPOFOL;  Surgeon: Robert Bellow, MD;  Location: [facility] ENDOSCOPY;  Service: Endoscopy;  Laterality: N/A;     Prior to Admission medications   Medication Sig Start Date End Date Taking? Authorizing Provider  cyclobenzaprine (FLEXERIL) 10 MG tablet Take 10 mg by mouth 3 (three) times daily.  01/07/15   Historical Provider, MD  methadone (DOLOPHINE) 5 MG tablet Take 5 mg by mouth daily.     Historical Provider, MD  metoCLOPramide (REGLAN) 10 MG tablet Take 1 tablet (10 mg total) by mouth as directed. 1 tabs 30 minutes before beginning prep and 4 hr later if needed for nausea. 02/12/15   Robert Bellow, MD  traZODone (DESYREL) 50 MG tablet Take 50 mg by mouth at bedtime.    Historical Provider, MD  venlafaxine XR (EFFEXOR-XR) 75 MG 24 hr capsule Take 75 mg by mouth daily with breakfast.  01/07/15 01/07/16  Historical Provider, MD     Allergies Bactrim [sulfamethoxazole-trimethoprim]; Ciprofloxacin; and Celebrex  [celecoxib]   Family History  Problem Relation Age of Onset  . Breast cancer Mother   . Breast cancer Paternal Aunt   . Breast cancer Paternal Aunt     Social History Social History  Substance Use Topics  . Smoking status: Never Smoker  . Smokeless tobacco: Never Used  . Alcohol use 0.0 oz/week     Comment: rare    Review of Systems  Constitutional:   No fever or chills.   Musculoskeletal:   Left middle finger pain Neurological:  No weakness or paresthesia 10-point ROS otherwise negative.  ____________________________________________   PHYSICAL EXAM:  VITAL SIGNS: ED Triage Vitals  Enc Vitals Group     BP 12/13/15 0122 (!) 162/107     Pulse Rate 12/13/15 0122 89     Resp 12/13/15 0122 18     Temp 12/13/15 0122 98.3 F (36.8 C)     Temp Source 12/13/15 0122 Oral     SpO2 12/13/15 0122 98 %     Weight 12/13/15 0123 134 lb (60.8 kg)     Height 12/13/15 0123 4\' 10"  (1.473 m)     Head Circumference --      Peak Flow --      Pain Score 12/13/15 0123 8     Pain Loc --      Pain Edu? --      Excl. in [place]? --     Vital signs reviewed,  nursing assessments reviewed.   Constitutional:   Alert and oriented. Well appearing and in no distress.   Head:   Normocephalic and atraumatic. Musculoskeletal:   One centimeters superficial linear laceration to left third finger on dorsal radial side just distal to the DIP. Intact range of motion. Intact sensation. Normal capillary refill. Neurologic:   Normal speech and language.  CN 2-10 normal. Motor grossly intact. No gross focal neurologic deficits are appreciated.  Skin:    Skin is warm, dry and intact. No rash noted.  No petechiae, purpura, or bullae.  ____________________________________________    LABS (pertinent positives/negatives) (all labs ordered are listed, but only abnormal results are displayed) Labs Reviewed - No data to  display ____________________________________________   EKG    ____________________________________________    RADIOLOGY    ____________________________________________   PROCEDURES Procedures LACERATION REPAIR Performed by: Carrie Mew Authorized by: Carrie Mew Consent: Verbal consent obtained. Risks and benefits: risks, benefits and alternatives were discussed Consent given by: patient Patient identity confirmed: provided demographic data Prepped and Draped in normal sterile fashion Wound explored  Laceration Location: Left third finger  Laceration Length: 1cm  No Foreign Bodies seen or palpated  Anesthesia: None  Amount of cleaning: standard  Skin closure: Topical skin adhesive   Patient tolerance: Patient tolerated the procedure well with no immediate complications.  ____________________________________________   INITIAL IMPRESSION / ASSESSMENT AND PLAN / ED COURSE  Pertinent labs & imaging results that were available during my care of the patient were reviewed by me and considered in my medical decision making (see chart for details).  Patient well appearing, presents with superficial knife wound to left third finger. No evidence of any fracture dislocation joint violation or tendon injury. Wound cleansed and repaired with Dermabond. Follow up with primary care.     Clinical Course   ____________________________________________   FINAL CLINICAL IMPRESSION(S) / ED DIAGNOSES  Final diagnoses:  Laceration of finger, initial encounter       Portions of this note were generated with dragon dictation software. Dictation errors may occur despite best attempts at proofreading.    Carrie Mew, MD 12/13/15 [phone]

## 2015-12-13 NOTE — ED Notes (Signed)
Pt states accidentally cut her fingers with a knife while cleaning at about midnight. Pt states she put pressure on it for about an hour and could not get the bleeding to stop, pt states she does not take blood thinners, or anti coagulants.

## 2015-12-13 NOTE — ED Notes (Signed)
T-dap vaccine given, patient tolerated it well, discharge instructions reviewed, home care instructions reviewed, patient verbalized understanding, patient is ambulatory, left by self.

## 2016-02-16 LAB — HM PAP SMEAR: HM Pap smear: NEGATIVE

## 2016-02-19 ENCOUNTER — Other Ambulatory Visit: Payer: Self-pay | Admitting: Obstetrics & Gynecology

## 2016-02-19 DIAGNOSIS — Z1231 Encounter for screening mammogram for malignant neoplasm of breast: Secondary | ICD-10-CM

## 2016-04-01 ENCOUNTER — Ambulatory Visit: Payer: Medicare Other

## 2016-04-25 ENCOUNTER — Ambulatory Visit
Admission: RE | Admit: 2016-04-25 | Discharge: 2016-04-25 | Disposition: A | Discharge status: Home / Self Care | Payer: Medicare Other | Source: Ambulatory Visit | Attending: Obstetrics & Gynecology | Admitting: Obstetrics & Gynecology

## 2016-04-25 DIAGNOSIS — Z1231 Encounter for screening mammogram for malignant neoplasm of breast: Secondary | ICD-10-CM | POA: Insufficient documentation

## 2016-04-25 LAB — HM MAMMOGRAPHY

## 2016-10-03 ENCOUNTER — Ambulatory Visit (INDEPENDENT_AMBULATORY_CARE_PROVIDER_SITE_OTHER): Payer: Medicare Other | Admitting: Obstetrics & Gynecology

## 2016-10-03 ENCOUNTER — Encounter: Payer: Self-pay | Admitting: Obstetrics & Gynecology

## 2016-10-03 VITALS — BP 130/80 | HR 97 | Ht 59.0 in | Wt 124.0 lb

## 2016-10-03 DIAGNOSIS — N952 Postmenopausal atrophic vaginitis: Secondary | ICD-10-CM | POA: Diagnosis not present

## 2016-10-03 DIAGNOSIS — N390 Urinary tract infection, site not specified: Secondary | ICD-10-CM | POA: Diagnosis not present

## 2016-10-03 MED ORDER — OSPEMIFENE 60 MG PO TABS: 1.0000 | ORAL_TABLET | Freq: Every day | ORAL | 11 refills | Status: DC

## 2016-10-03 MED ORDER — NITROFURANTOIN MONOHYD MACRO 100 MG PO CAPS: 100.0000 mg | ORAL_CAPSULE | ORAL | 5 refills | Status: DC | PRN

## 2016-10-03 NOTE — Progress Notes (Signed)
  History of Present Illness:  Sylvia Cole is a 61 y.o. who continues with vaginal dryness and some pain w intercourse.  Also has noted recurrent UTIs after most episodes of sex.  Has new partner x 1 year and he travels a lot but when in town and active she usually ends up with a UTI.  Treated 2 weeks ago w Amoxocillin successfully.  Has not tried meds for atrophy recently.  Concerned over FH breast cancer/   PMHx: She  has a past medical history of Anxiety; Arthritis; and Chronic back pain. Also,  has a past surgical history that includes Back surgery; Abdominal hysterectomy; Colonoscopy with propofol (N/A, 04/14/2015); and Breast biopsy (Bilateral)., family history includes Breast cancer in her paternal aunt and paternal aunt; Breast cancer (age of onset: 85) in her mother.,  reports that she has never smoked. She has never used smokeless tobacco. She reports that she drinks alcohol. She reports that she does not use drugs. No outpatient prescriptions have been marked as taking for the 10/03/16 encounter (Office Visit) with Gae Dry, MD.  . Also, is allergic to bactrim [sulfamethoxazole-trimethoprim]; ciprofloxacin; and celebrex [celecoxib]..  Review of Systems  Constitutional: Negative for chills, fever and malaise/fatigue.  HENT: Negative for congestion, sinus pain and sore throat.   Eyes: Negative for blurred vision and pain.  Respiratory: Negative for cough and wheezing.   Cardiovascular: Negative for chest pain and leg swelling.  Gastrointestinal: Negative for abdominal pain, constipation, diarrhea, heartburn, nausea and vomiting.  Genitourinary: Negative for dysuria, frequency, hematuria and urgency.  Musculoskeletal: Negative for back pain, joint pain, myalgias and neck pain.  Skin: Negative for itching and rash.  Neurological: Negative for dizziness, tremors and weakness.  Endo/Heme/Allergies: Does not bruise/bleed easily.  Psychiatric/Behavioral: Negative for depression. The  patient is not nervous/anxious and does not have insomnia.    Physical Exam:  BP 130/80   Pulse 97   Ht 4\' 11"  (1.499 m)   Wt 124 lb (56.2 kg)   BMI 25.04 kg/m  Body mass index is 25.04 kg/m. Constitutional: Well nourished, well developed female in no acute distress.  Abdomen: diffusely non tender to palpation, non distended, and no masses, hernias Neuro: Grossly intact Psych:  Normal mood and affect.   Back: No CVAT  Assessment:  Problem List Items Addressed This Visit      Genitourinary   Recurrent UTI - Primary   Relevant Medications   nitrofurantoin, macrocrystal-monohydrate, (MACROBID) 100 MG capsule   Vaginal atrophy    Plan: She will undergo begin treatment for vaginal atrophy as well as suppression of recurrent UTI in her medical therapy.  Inis Sizer, Vag ERT, Replens, and po ERT all discussed with patient.  Breats cancer considerations counseled.  Prefers pill therapy of Osphena, info gv and Rx to [facility].  Take Macrobid after each episode of sex to suppress/prevent infection.  Hopefully, resolution of vaginal atrophy will also be proactive in preventing scenerio for UTI.  She was amenable to this plan and we will see her back for annual/PRN.  Barnett Applebaum, MD, [facility], [facility] 10/03/2016  1:40 PM

## 2016-10-03 NOTE — Patient Instructions (Signed)
Ospemifene oral tablets What is this medicine? OSPEMIFENE (os PEM i feen) is used to treat painful sexual intercourse in females after menopause, a symptom of menopause that occurs due to changes in and around the vagina. This medicine may be used for other purposes; ask your health care provider or pharmacist if you have questions. COMMON BRAND NAME(S): Osphena What should I tell my health care provider before I take this medicine? They need to know if you have any of these conditions: -cancer, such as breast, uterine, or other cancer -heart disease -history of blood clots -history of stroke -history of vaginal bleeding -liver disease -premenopausal -smoke tobacco -an unusual or allergic reaction to ospemifene, other medicines, foods, dyes, or preservatives -pregnant or trying to get pregnant -breast-feeding How should I use this medicine? Take this medicine by mouth with a glass of water. Take this medicine with food. Follow the directions on the prescription label. Do not take your medicine more often than directed. Talk to your pediatrician regarding the use of this medicine in children. Special care may be needed. Overdosage: If you think you have taken too much of this medicine contact a poison control center or emergency room at once. NOTE: This medicine is only for you. Do not share this medicine with others. What if I miss a dose? If you miss a dose, take it as soon as you can. If it is almost time for your next dose, take only that dose. Do not take double or extra doses. What may interact with this medicine? -doxycycline -estrogens -fluconazole -furosemide -glyburide -ketoconazole -phenytoin -rifampin -warfarin This list may not describe all possible interactions. Give your health care provider a list of all the medicines, herbs, non-prescription drugs, or dietary supplements you use. Also tell them if you smoke, drink alcohol, or use illegal drugs. Some items may  interact with your medicine. What should I watch for while using this medicine? Visit your health care professional for regular checks on your progress. You will need a regular breast and pelvic exam and Pap smear while on this medicine. You should also discuss the need for regular mammograms with your health care professional, and follow his or her guidelines for these tests. Also, periodically discuss the need to continue taking this medicine. Taking this medicine for long periods of time may increase your risk for serious side effects. This medicine can increase the risk of developing a condition (endometrial hyperplasia) that may lead to cancer of the lining of the uterus. Taking progestins, another hormone drug, with this medicine lowers the risk of developing this condition. Therefore, if your uterus has not been removed (by a hysterectomy), your doctor may prescribe a progestin for you to take together with your estrogen. You should know, however, that taking estrogens with progestins may have additional health risks. You should discuss the use of estrogens and progestins with your health care professional to determine the benefits and risks for you. This medicine can rarely cause blood clots. You should avoid long periods of bed rest while taking this medicine. If you are going to have surgery, tell your doctor or health care professional that you are taking this medicine. This medicine should be stopped at least 4-6 weeks before surgery. After surgery, it should be restarted only after you are walking again. It should not be restarted while you still need long periods of bed rest. You should not smoke while taking this medicine. Smoking may also increase your risk of blood clots. Smoking can also   decrease the effects of this medicine. This medicine does not prevent hot flashes. It may cause hot flashes in some patients. If you have any reason to think you are pregnant; stop taking this medicine at  once and contact your doctor or health care professional. What side effects may I notice from receiving this medicine? Side effects that you should report to your doctor or health care professional as soon as possible: -breathing problems -changes in vision -confusion, trouble speaking or understanding -new breast lumps -pain, swelling, warmth in the leg -pelvic pain or pressure -severe headaches -sudden chest pain -sudden numbness or weakness of the face, arm or leg -trouble walking, dizziness, loss of balance or coordination -unusual vaginal bleeding patterns -vaginal discharge that is bloody or brown Side effects that usually do not require medical attention (report to your doctor or health care professional if they continue or are bothersome): -hot flushes or flashes -increased sweating -muscle cramps -vaginal discharge (white or clear) This list may not describe all possible side effects. Call your doctor for medical advice about side effects. You may report side effects to FDA at 1-800-FDA-1088. Where should I keep my medicine? Keep out of the reach of children. Store at room temperature between 20 and 25 degrees C (68 and 77 degrees F). Protect from light. Keep container tightly closed. Throw away any unused medicine after the expiration date. NOTE: This sheet is a summary. It may not cover all possible information. If you have questions about this medicine, talk to your doctor, pharmacist, or health care provider.  2018 Elsevier/Gold Standard (2015-04-23 10:08:00)  

## 2016-10-04 ENCOUNTER — Telehealth: Payer: Self-pay

## 2016-10-04 NOTE — Telephone Encounter (Signed)
Pt states Osphena will be cheaper as a 90d supply instead monthly.  Please change to 90d supply. [phone]

## 2016-10-06 MED ORDER — OSPEMIFENE 60 MG PO TABS: 1.0000 | ORAL_TABLET | Freq: Every day | ORAL | 3 refills | Status: DC

## 2016-10-06 NOTE — Telephone Encounter (Signed)
Pt aware.

## 2016-10-06 NOTE — Telephone Encounter (Signed)
This change has been submitted; let her know

## 2017-01-11 ENCOUNTER — Emergency Department
Admission: EM | Admit: 2017-01-11 | Discharge: 2017-01-11 | Disposition: A | Discharge status: Home / Self Care | Payer: Medicare Other | Attending: Emergency Medicine | Admitting: Emergency Medicine

## 2017-01-11 ENCOUNTER — Encounter: Payer: Self-pay | Admitting: Emergency Medicine

## 2017-01-11 DIAGNOSIS — M545 Low back pain: Secondary | ICD-10-CM | POA: Diagnosis present

## 2017-01-11 DIAGNOSIS — Z79899 Other long term (current) drug therapy: Secondary | ICD-10-CM | POA: Insufficient documentation

## 2017-01-11 DIAGNOSIS — G8929 Other chronic pain: Secondary | ICD-10-CM | POA: Insufficient documentation

## 2017-01-11 DIAGNOSIS — M5441 Lumbago with sciatica, right side: Secondary | ICD-10-CM | POA: Diagnosis not present

## 2017-01-11 MED ORDER — HYDROMORPHONE HCL 1 MG/ML IJ SOLN
1.0000 mg | Freq: Once | INTRAMUSCULAR | Status: AC
  Administered 2017-01-11: 1 mg via INTRAMUSCULAR
  Filled 2017-01-11: qty 1

## 2017-01-11 MED ORDER — PREDNISONE 10 MG (21) PO TBPK: ORAL_TABLET | Freq: Every day | ORAL | 0 refills | Status: DC

## 2017-01-11 NOTE — ED Provider Notes (Signed)
[facility] Emergency Department Provider Note   First MD Initiated Contact with Patient 01/11/17 [phone]     (approximate)  I have reviewed the triage vital signs and the nursing notes.   HISTORY  Chief Complaint Back Pain    HPI Sylvia Cole is a 61 y.o. female blow list of chronic medical conditions including chronic back pain for which she's prescribed methadone by Dr. Lovie Macadamia presents to the emergency department with 10 out of 10 low back pain radiating down posterior right leg. Patient states that she had a fall yesterday no head injury which precipitated the pain. Patient denies any weakness numbness in the lower extremities. Patient denies any change in urinary or bowel habits. Patient states pain unrelieved with methadone and also just administered yesterday.  Past Medical History:  Diagnosis Date  . Anxiety   . Arthritis   . Chronic back pain     Patient Active Problem List   Diagnosis Date Noted  . Recurrent UTI 10/03/2016  . Vaginal atrophy 10/03/2016  . Skin lesion of breast 10/20/2015    Past Surgical History:  Procedure Laterality Date  . ABDOMINAL HYSTERECTOMY    . BACK SURGERY    . BREAST BIOPSY Bilateral    neg  . COLONOSCOPY WITH PROPOFOL N/A 04/14/2015   Procedure: COLONOSCOPY WITH PROPOFOL;  Surgeon: Robert Bellow, MD;  Location: [facility] ENDOSCOPY;  Service: Endoscopy;  Laterality: N/A;    Prior to Admission medications   Medication Sig Start Date End Date Taking? Authorizing Provider  cyclobenzaprine (FLEXERIL) 10 MG tablet Take 10 mg by mouth 3 (three) times daily.  01/07/15   [provider]  methadone (DOLOPHINE) 5 MG tablet Take 5 mg by mouth daily.     [provider]  metoCLOPramide (REGLAN) 10 MG tablet Take 1 tablet (10 mg total) by mouth as directed. 1 tabs 30 minutes before beginning prep and 4 hr later if needed for nausea. 02/12/15   Robert Bellow, MD  nitrofurantoin,  macrocrystal-monohydrate, (MACROBID) 100 MG capsule Take 1 capsule (100 mg total) by mouth as needed. 10/03/16   Gae Dry, MD  Ospemifene (OSPHENA) 60 MG TABS Take 1 tablet by mouth daily. 10/06/16   Gae Dry, MD  traZODone (DESYREL) 50 MG tablet Take 50 mg by mouth at bedtime.    [provider]  venlafaxine XR (EFFEXOR-XR) 75 MG 24 hr capsule Take 75 mg by mouth daily with breakfast.  01/07/15 01/07/16  [provider]    Allergies Bactrim [sulfamethoxazole-trimethoprim]; Ciprofloxacin; and Celebrex [celecoxib]  Family History  Problem Relation Age of Onset  . Breast cancer Mother 86  . Breast cancer Paternal Aunt   . Breast cancer Paternal Aunt     Social History Social History  Substance Use Topics  . Smoking status: Never Smoker  . Smokeless tobacco: Never Used  . Alcohol use 0.0 oz/week     Comment: rare    Review of Systems Constitutional: No fever/chills Eyes: No visual changes. ENT: No sore throat. Cardiovascular: Denies chest pain. Respiratory: Denies shortness of breath. Gastrointestinal: No abdominal pain.  No nausea, no vomiting.  No diarrhea.  No constipation. Genitourinary: Negative for dysuria. Musculoskeletal: Negative for neck pain.  Positive for back pain. Integumentary: Negative for rash. Neurological: Negative for headaches, focal weakness or numbness.  ____________________________________________   PHYSICAL EXAM:  VITAL SIGNS: ED Triage Vitals [01/11/17 0456]  Enc Vitals Group     BP (!) 176/100  Pulse Rate 84     Resp 18     Temp 98 F (36.7 C)     Temp Source Oral     SpO2 100 %     Weight 52.2 kg (115 lb)     Height 1.473 m (4\' 10" )     Head Circumference      Peak Flow      Pain Score 10     Pain Loc      Pain Edu?      Excl. in [place]?     Constitutional: Alert and oriented. Apparent discomfort. Eyes: Conjunctivae are normal.  Head: Atraumatic. Mouth/Throat: Mucous membranes are moist. Oropharynx  non-erythematous. Neck: No stridor.   Cardiovascular: Normal rate, regular rhythm. Good peripheral circulation. Grossly normal heart sounds. Respiratory: Normal respiratory effort.  No retractions. Lungs CTAB. Gastrointestinal: Soft and nontender. No distention.  Musculoskeletal: No lower extremity tenderness nor edema. No gross deformities of extremities. Pain with lumbar paraspinal muscle palpation. Positive bilateral lower extremity straight right leg raise Neurologic:  Normal speech and language. No gross focal neurologic deficits are appreciated.  Skin:  Skin is warm, dry and intact. No rash noted. Psychiatric: Mood and affect are normal. Speech and behavior are normal.  ____________________________________________     Procedures   ____________________________________________   INITIAL IMPRESSION / ASSESSMENT AND PLAN / ED COURSE  As part of my medical decision making, I reviewed the following data within the electronic MEDICAL RECORD [id number] year old female presenting with history and physical exam as stated above consistent with chronic back pain with acute exacerbation. Patient given IM Dilaudid in the emergency department with recommendation to follow-up with Dr. Lovie Macadamia.     ____________________________________________  FINAL CLINICAL IMPRESSION(S) / ED DIAGNOSES  Final diagnoses:  Chronic right-sided low back pain with right-sided sciatica     MEDICATIONS GIVEN DURING THIS VISIT:  Medications  HYDROmorphone (DILAUDID) injection 1 mg (1 mg Intramuscular Given 01/11/17 0605)     NEW OUTPATIENT MEDICATIONS STARTED DURING THIS VISIT:  New Prescriptions   No medications on file    Modified Medications   No medications on file    Discontinued Medications   No medications on file     Note:  This document was prepared using Dragon voice recognition software and may include unintentional dictation errors.    Gregor Hams, MD 01/12/17 [phone]

## 2017-01-11 NOTE — ED Triage Notes (Addendum)
Patient ambulatory to triage with steady gait, without difficulty or distress noted; pt reports falling yesterday, c/o lower back pain radiating down right leg; pt with hx chronic back pain

## 2017-02-15 ENCOUNTER — Ambulatory Visit (INDEPENDENT_AMBULATORY_CARE_PROVIDER_SITE_OTHER): Payer: Medicare Other | Admitting: Obstetrics & Gynecology

## 2017-02-15 ENCOUNTER — Encounter: Payer: Self-pay | Admitting: Obstetrics & Gynecology

## 2017-02-15 VITALS — BP 140/82 | HR 87 | Ht <= 58 in | Wt 123.0 lb

## 2017-02-15 DIAGNOSIS — N952 Postmenopausal atrophic vaginitis: Secondary | ICD-10-CM

## 2017-02-15 DIAGNOSIS — Z1231 Encounter for screening mammogram for malignant neoplasm of breast: Secondary | ICD-10-CM | POA: Diagnosis not present

## 2017-02-15 DIAGNOSIS — Z1239 Encounter for other screening for malignant neoplasm of breast: Secondary | ICD-10-CM

## 2017-02-15 DIAGNOSIS — Z1211 Encounter for screening for malignant neoplasm of colon: Secondary | ICD-10-CM | POA: Diagnosis not present

## 2017-02-15 DIAGNOSIS — Z0000 Encounter for general adult medical examination without abnormal findings: Secondary | ICD-10-CM

## 2017-02-15 DIAGNOSIS — Z01419 Encounter for gynecological examination (general) (routine) without abnormal findings: Secondary | ICD-10-CM | POA: Diagnosis not present

## 2017-02-15 DIAGNOSIS — Z113 Encounter for screening for infections with a predominantly sexual mode of transmission: Secondary | ICD-10-CM

## 2017-02-15 MED ORDER — OSPEMIFENE 60 MG PO TABS: 1.0000 | ORAL_TABLET | Freq: Every day | ORAL | 3 refills | Status: DC

## 2017-02-15 NOTE — Progress Notes (Signed)
HPI:      [name] is a 61 y.o. G2P0011 who LMP was in the past, she presents today for her annual examination.  The patient has no complaints today. The patient is sexually active. Herlast pap: approximate date 2017 and was normal and last mammogram: approximate date 04/2016 and was normal.  The patient does perform self breast exams.  There is notable family history of breast or ovarian cancer in her family.  TC Model 16 lifetime risk (unable to do BRCA due ot cost concerns).  The patient is not taking hormone replacement therapy. Patient denies post-menopausal vaginal bleeding.   The patient has regular exercise: yes. The patient denies current symptoms of depression.  Osphena started recently for dryness and dyspareunia, improved.  GYN Hx: Last Colonoscopy:2 years ago. Normal.  Last DEXA: never ago.    PMHx: Past Medical History:  Diagnosis Date  . Anxiety   . Arthritis   . Chronic back pain    Past Surgical History:  Procedure Laterality Date  . ABDOMINAL HYSTERECTOMY    . BACK SURGERY    . BREAST BIOPSY Bilateral    neg   Family History  Problem Relation Age of Onset  . Breast cancer Mother 44  . Breast cancer Paternal Aunt   . Breast cancer Paternal Aunt    Social History   Tobacco Use  . Smoking status: Never Smoker  . Smokeless tobacco: Never Used  Substance Use Topics  . Alcohol use: Yes    Alcohol/week: 0.0 oz    Comment: rare  . Drug use: No    Current Outpatient Medications:  .  cyclobenzaprine (FLEXERIL) 10 MG tablet, Take 10 mg by mouth 3 (three) times daily. , Disp: , Rfl:  .  methadone (DOLOPHINE) 5 MG tablet, Take 5 mg by mouth daily. , Disp: , Rfl:  .  metoCLOPramide (REGLAN) 10 MG tablet, Take 1 tablet (10 mg total) by mouth as directed. 1 tabs 30 minutes before beginning prep and 4 hr later if needed for nausea., Disp: 2 tablet, Rfl: 0 .  nitrofurantoin, macrocrystal-monohydrate, (MACROBID) 100 MG capsule, Take 1 capsule (100 mg total) by  mouth as needed., Disp: 30 capsule, Rfl: 5 .  Ospemifene (OSPHENA) 60 MG TABS, Take 1 tablet by mouth daily., Disp: 90 tablet, Rfl: 3 .  predniSONE (STERAPRED UNI-PAK 21 TAB) 10 MG (21) TBPK tablet, Take by mouth daily. dispense steroid taper pack as directed, Disp: 21 tablet, Rfl: 0 .  traZODone (DESYREL) 50 MG tablet, Take 50 mg by mouth at bedtime., Disp: , Rfl:  .  venlafaxine XR (EFFEXOR-XR) 75 MG 24 hr capsule, Take 75 mg by mouth daily with breakfast. , Disp: , Rfl:  Allergies: Bactrim [sulfamethoxazole-trimethoprim]; Ciprofloxacin; and Celebrex [celecoxib]  Review of Systems  Constitutional: Negative for chills, fever and malaise/fatigue.  HENT: Negative for congestion, sinus pain and sore throat.   Eyes: Negative for blurred vision and pain.  Respiratory: Negative for cough and wheezing.   Cardiovascular: Negative for chest pain and leg swelling.  Gastrointestinal: Negative for abdominal pain, constipation, diarrhea, heartburn, nausea and vomiting.  Genitourinary: Negative for dysuria, frequency, hematuria and urgency.  Musculoskeletal: Negative for back pain, joint pain, myalgias and neck pain.  Skin: Negative for itching and rash.  Neurological: Negative for dizziness, tremors and weakness.  Endo/Heme/Allergies: Does not bruise/bleed easily.  Psychiatric/Behavioral: Negative for depression. The patient is not nervous/anxious and does not have insomnia.     Objective: BP 140/82   Pulse  87   Ht '4\' 10"'  (1.473 m)   Wt 123 lb (55.8 kg)   BMI 25.71 kg/m   Filed Weights   02/15/17 0955  Weight: 123 lb (55.8 kg)   Body mass index is 25.71 kg/m. Physical Exam  Constitutional: She is oriented to person, place, and time. She appears well-developed and well-nourished. No distress.  Genitourinary: Rectum normal and vagina normal. Pelvic exam was performed with patient supine. There is no rash or lesion on the right labia. There is no rash or lesion on the left labia. Vagina exhibits  no lesion. No bleeding in the vagina. Right adnexum does not display mass and does not display tenderness. Left adnexum does not display mass and does not display tenderness.  Genitourinary Comments: Absent Uterus Absent cervix Vaginal cuff well healed  HENT:  Head: Normocephalic and atraumatic. Head is without laceration.  Right Ear: Hearing normal.  Left Ear: Hearing normal.  Nose: No epistaxis.  No foreign bodies.  Mouth/Throat: Uvula is midline, oropharynx is clear and moist and mucous membranes are normal.  Eyes: Pupils are equal, round, and reactive to light.  Neck: Normal range of motion. Neck supple. No thyromegaly present.  Cardiovascular: Normal rate and regular rhythm. Exam reveals no gallop and no friction rub.  No murmur heard. Pulmonary/Chest: Effort normal and breath sounds normal. No respiratory distress. She has no wheezes. Right breast exhibits no mass, no skin change and no tenderness. Left breast exhibits no mass, no skin change and no tenderness.  Abdominal: Soft. Bowel sounds are normal. She exhibits no distension. There is no tenderness (nuswab). There is no rebound.  Musculoskeletal: Normal range of motion.  Neurological: She is alert and oriented to person, place, and time. No cranial nerve deficit.  Skin: Skin is warm and dry.  Psychiatric: She has a normal mood and affect. Judgment normal.  Vitals reviewed.  Assessment: Annual Exam 1. Vaginal atrophy   2. Annual physical exam   3. Screen for colon cancer   4. Screening for breast cancer    Plan:            1.  Cervical Screening-  Pap smear schedule reviewed with patient  2. Breast screening- Exam annually and mammogram scheduled  3. Colonoscopy every 10 years, Hemoccult testing after age 53  4. Labs managed by PCP  5. Counseling for hormonal therapy: none. Cont Osphena for atrophy and dyspareunia.  6. STD screening as she has new partner (husband died 2 years ago).    F/U  Return in about 1 year  (around 02/15/2018) for Annual.  Barnett Applebaum, MD, [facility], [facility] 02/15/2017  10:06 AM

## 2017-02-15 NOTE — Patient Instructions (Signed)
PAP every five years Mammogram every year    Call [phone] to schedule at [facility] Colonoscopy every 10 years Labs yearly (with PCP)

## 2017-02-17 LAB — RPR+HSVIGM+HBSAG+HSV2(IGG)+...
HIV Screen 4th Generation wRfx: NONREACTIVE
HSV 2 IGG, TYPE SPEC: 23.2 {index} — AB (ref 0.00–0.90)
Hepatitis B Surface Ag: NEGATIVE
RPR Ser Ql: NONREACTIVE

## 2017-02-17 NOTE — Progress Notes (Signed)
D/w pt.  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 Never has had sx's.  Recommended monitoring for sx's, no treatment at this time.

## 2017-02-21 LAB — GC/CHLAMYDIA PROBE AMP
CHLAMYDIA, DNA PROBE: NEGATIVE
Neisseria gonorrhoeae by PCR: NEGATIVE

## 2017-04-20 ENCOUNTER — Telehealth: Payer: Self-pay

## 2017-04-20 MED ORDER — PRASTERONE 6.5 MG VA INST
6.5000 mg | VAGINAL_INSERT | Freq: Every day | VAGINAL | 12 refills | Status: DC
Start: 2017-04-20 — End: 2017-05-01

## 2017-04-20 NOTE — Telephone Encounter (Signed)
Intrarosa vaginal suppository is only other non-estrogen therapy. Not sure cost there. Will call in Rx if wants to change. (apply daily vaginally, not associated with mess or discharge ) [place]

## 2017-04-20 NOTE — Telephone Encounter (Signed)
Pt currently taking osphena 60mg  and using [facility] but has been told insurance will not pay for it at all and due to cost pt requests different hormonal medication. Please advise. Cb# [phone] thank you.

## 2017-04-20 NOTE — Telephone Encounter (Signed)
Pt aware.

## 2017-04-20 NOTE — Telephone Encounter (Signed)
ERx done.  We do have coupon in office if that would make a difference

## 2017-04-20 NOTE — Telephone Encounter (Signed)
Pt requests intrarosa to be sent in to [place] and pharmacy will notify her of cost. She will let us know if it needs to be changed.

## 2017-04-27 ENCOUNTER — Telehealth: Payer: Self-pay

## 2017-04-27 NOTE — Telephone Encounter (Signed)
Pt calling, osphena not covered by ins., suppositories not covered either - >$200.  [phone].  Adv pt to call ins and get a list of HRT it will cover and call us back.   Pt states she will do that this pm.

## 2017-05-01 ENCOUNTER — Other Ambulatory Visit: Payer: Self-pay | Admitting: Obstetrics & Gynecology

## 2017-05-01 ENCOUNTER — Telehealth: Payer: Self-pay | Admitting: Obstetrics & Gynecology

## 2017-05-01 MED ORDER — OSPEMIFENE 60 MG PO TABS: 1.0000 | ORAL_TABLET | Freq: Every day | ORAL | 11 refills | Status: DC

## 2017-05-01 NOTE — Telephone Encounter (Signed)
Pt states that's correct  osphena

## 2017-05-01 NOTE — Telephone Encounter (Signed)
Pt called her Ins company and they are stating that they will pay for the meds(Osthena 60mg )  if the Dr would do a prior-authorization for her hormone pill. Pt has the [facility] ins.  Cb# [phone]

## 2017-05-01 NOTE — Telephone Encounter (Signed)
Rx for [facility] updated.  We did this back in Nov too.  Rx to [facility] as they provide at better price, but still may need PA from insurance.  Confirm this is what she wants, bc message from [place] also says "hormone pill" and this is not a hormone. Thx PH

## 2017-05-01 NOTE — Telephone Encounter (Signed)
If you send the prescription I will start the PA, if this is what you want Pt to try,

## 2017-05-11 ENCOUNTER — Encounter: Payer: Self-pay | Admitting: General Surgery

## 2017-05-11 ENCOUNTER — Ambulatory Visit: Payer: Medicare Other | Admitting: General Surgery

## 2017-05-11 VITALS — BP 166/90 | HR 104 | Resp 16 | Ht <= 58 in | Wt 120.0 lb

## 2017-05-11 DIAGNOSIS — L918 Other hypertrophic disorders of the skin: Secondary | ICD-10-CM

## 2017-05-11 NOTE — Patient Instructions (Addendum)
The patient is aware to call back for any questions or concerns. neosporin and band aid until healed

## 2017-05-11 NOTE — Progress Notes (Signed)
Patient ID: Sylvia Cole, female   DOB: 08/27/1955, 62 y.o.   MRN: [id number]  Chief Complaint  Patient presents with  . Breast Problem    HPI Sylvia Cole is a 62 y.o. female.  Here for evaluation of a mole left breast. She states this mole has been there but over the weekend it started changing and turning red. She does admit to tenderness with pressure. She does have some new bras and thinks they are rubbing that area.   HPI  Past Medical History:  Diagnosis Date  . Anxiety   . Arthritis   . Chronic back pain     Past Surgical History:  Procedure Laterality Date  . ABDOMINAL HYSTERECTOMY    . BACK SURGERY    . BREAST BIOPSY Bilateral    neg  . COLONOSCOPY WITH PROPOFOL N/A 04/14/2015   Procedure: COLONOSCOPY WITH PROPOFOL;  Surgeon: Robert Bellow, MD;  Location: [facility] ENDOSCOPY;  Service: Endoscopy;  Laterality: N/A;    Family History  Problem Relation Age of Onset  . Breast cancer Mother 43  . Breast cancer Paternal Aunt   . Breast cancer Paternal Aunt     Social History Social History   Tobacco Use  . Smoking status: Never Smoker  . Smokeless tobacco: Never Used  Substance Use Topics  . Alcohol use: Yes    Alcohol/week: 0.0 oz    Comment: rare  . Drug use: No    Allergies  Allergen Reactions  . Bactrim [Sulfamethoxazole-Trimethoprim] Other (See Comments)    headache  . Ciprofloxacin Other (See Comments)    headache  . Celebrex [Celecoxib] Rash    Current Outpatient Medications  Medication Sig Dispense Refill  . cyclobenzaprine (FLEXERIL) 10 MG tablet Take 10 mg by mouth 3 (three) times daily.     . methadone (DOLOPHINE) 5 MG tablet Take 5 mg by mouth daily.     . metoCLOPramide (REGLAN) 10 MG tablet Take 1 tablet (10 mg total) by mouth as directed. 1 tabs 30 minutes before beginning prep and 4 hr later if needed for nausea. 2 tablet 0  . nitrofurantoin, macrocrystal-monohydrate, (MACROBID) 100 MG capsule Take 1 capsule (100 mg total) by mouth  as needed. 30 capsule 5  . Ospemifene (OSPHENA) 60 MG TABS Take 1 tablet by mouth daily. 30 tablet 11  . predniSONE (STERAPRED UNI-PAK 21 TAB) 10 MG (21) TBPK tablet Take by mouth daily. dispense steroid taper pack as directed 21 tablet 0  . traZODone (DESYREL) 50 MG tablet Take 50 mg by mouth at bedtime.    Marland Kitchen venlafaxine XR (EFFEXOR-XR) 75 MG 24 hr capsule Take 75 mg by mouth daily with breakfast.      No current facility-administered medications for this visit.     Review of Systems Review of Systems  Constitutional: Negative.   Respiratory: Negative.   Cardiovascular: Negative.     Blood pressure (!) 166/90, pulse (!) 104, resp. rate 16, height 4\' 10"  (1.473 m), weight 120 lb (54.4 kg).  Physical Exam Physical Exam  Constitutional: She is oriented to person, place, and time. She appears well-developed and well-nourished.  Neurological: She is alert and oriented to person, place, and time.  Skin: Skin is warm and dry.  Skin tag central chest  Psychiatric: Her behavior is normal.    Data Reviewed Lesion removed with thermal cautery after alcohol prep.  Bacitracin and Band-Aid applied.  Assessment    Irritated skin tag.    Plan    Follow  up as needed. neosporin and band aid until healed      HPI, Physical Exam, Assessment and Plan have been scribed under the direction and in the presence of Robert Bellow, MD. Sylvia Fetch, RN  I have completed the exam and reviewed the above documentation for accuracy and completeness.  I agree with the above.  Haematologist has been used and any errors in dictation or transcription are unintentional.  Hervey Ard, M.D., F.A.C.S.  Forest Gleason Baley Lorimer 05/12/2017, 4:16 PM

## 2017-05-12 DIAGNOSIS — L918 Other hypertrophic disorders of the skin: Secondary | ICD-10-CM | POA: Insufficient documentation

## 2017-05-18 ENCOUNTER — Ambulatory Visit
Admission: RE | Admit: 2017-05-18 | Discharge: 2017-05-18 | Disposition: A | Discharge status: Home / Self Care | Payer: Medicare Other | Source: Ambulatory Visit | Attending: Obstetrics & Gynecology | Admitting: Obstetrics & Gynecology

## 2017-05-18 ENCOUNTER — Encounter: Payer: Self-pay | Admitting: Obstetrics & Gynecology

## 2017-05-18 DIAGNOSIS — Z1231 Encounter for screening mammogram for malignant neoplasm of breast: Secondary | ICD-10-CM | POA: Insufficient documentation

## 2017-05-18 DIAGNOSIS — Z1239 Encounter for other screening for malignant neoplasm of breast: Secondary | ICD-10-CM

## 2018-01-15 ENCOUNTER — Encounter: Payer: Self-pay | Admitting: Emergency Medicine

## 2018-01-15 ENCOUNTER — Emergency Department
Admission: EM | Admit: 2018-01-15 | Discharge: 2018-01-15 | Disposition: A | Discharge status: Home / Self Care | Payer: Medicare Other | Attending: Emergency Medicine | Admitting: Emergency Medicine

## 2018-01-15 ENCOUNTER — Other Ambulatory Visit: Payer: Self-pay

## 2018-01-15 DIAGNOSIS — F419 Anxiety disorder, unspecified: Secondary | ICD-10-CM | POA: Insufficient documentation

## 2018-01-15 DIAGNOSIS — M545 Low back pain: Secondary | ICD-10-CM | POA: Diagnosis present

## 2018-01-15 DIAGNOSIS — M5441 Lumbago with sciatica, right side: Secondary | ICD-10-CM | POA: Diagnosis not present

## 2018-01-15 DIAGNOSIS — Z79899 Other long term (current) drug therapy: Secondary | ICD-10-CM | POA: Diagnosis not present

## 2018-01-15 MED ORDER — PREDNISONE 10 MG (21) PO TBPK: ORAL_TABLET | ORAL | 0 refills | Status: DC

## 2018-01-15 MED ORDER — HYDROMORPHONE HCL 1 MG/ML IJ SOLN
1.0000 mg | Freq: Once | INTRAMUSCULAR | Status: AC
  Administered 2018-01-15: 1 mg via INTRAMUSCULAR
  Filled 2018-01-15: qty 1

## 2018-01-15 NOTE — ED Provider Notes (Signed)
[facility] Emergency Department Provider Note  ____________________________________________  Time seen: Approximately 11:15 PM  I have reviewed the triage vital signs and the nursing notes.   HISTORY  Chief Complaint Back Pain    HPI BRENLEY PRIORE is a 62 y.o. female with a history of methadone use (under Dr. Reuel Boom care), presents to the emergency department with acute on chronic low back pain with right lower extremity radiculopathy.  Patient is accompanied by her son who reports that patient has been bedbound due to pain for approximately 5 to 6 days.  Patient reports that she was feeling somewhat better today and attempted to clean her bathroom when she felt exquisite pain.  Patient reports that she could not get up from bathroom floor.  Patient reports that approximately once a year, her back pain becomes so severe that she needs a "shot".  She denies chest pain, chest tightness, shortness of breath, nausea, vomiting or abdominal pain.   Past Medical History:  Diagnosis Date  . Anxiety   . Arthritis   . Chronic back pain     Patient Active Problem List   Diagnosis Date Noted  . Skin tag 05/12/2017  . Recurrent UTI 10/03/2016  . Vaginal atrophy 10/03/2016  . Skin lesion of breast 10/20/2015    Past Surgical History:  Procedure Laterality Date  . ABDOMINAL HYSTERECTOMY    . BACK SURGERY    . BREAST BIOPSY Bilateral yrs ago   benign  . BREAST EXCISIONAL BIOPSY Bilateral yrs ago   benign  . COLONOSCOPY WITH PROPOFOL N/A 04/14/2015   Procedure: COLONOSCOPY WITH PROPOFOL;  Surgeon: Robert Bellow, MD;  Location: [facility] ENDOSCOPY;  Service: Endoscopy;  Laterality: N/A;    Prior to Admission medications   Medication Sig Start Date End Date Taking? Authorizing Provider  cyclobenzaprine (FLEXERIL) 10 MG tablet Take 10 mg by mouth 3 (three) times daily.  01/07/15   [provider]  methadone (DOLOPHINE) 5 MG tablet Take 5 mg by mouth  daily.     [provider]  metoCLOPramide (REGLAN) 10 MG tablet Take 1 tablet (10 mg total) by mouth as directed. 1 tabs 30 minutes before beginning prep and 4 hr later if needed for nausea. 02/12/15   Robert Bellow, MD  nitrofurantoin, macrocrystal-monohydrate, (MACROBID) 100 MG capsule Take 1 capsule (100 mg total) by mouth as needed. 10/03/16   Gae Dry, MD  Ospemifene (OSPHENA) 60 MG TABS Take 1 tablet by mouth daily. 05/01/17   Gae Dry, MD  predniSONE (STERAPRED UNI-PAK 21 TAB) 10 MG (21) TBPK tablet Take 6 tablets the first day, take 5 tablets the second day, take 4 tablets the third day, take 3 tablets the fourth day, take 2 tablets the fifth day, take 1 tablet the sixth day. 01/15/18   Lannie Fields, PA-C  traZODone (DESYREL) 50 MG tablet Take 50 mg by mouth at bedtime.    [provider]  venlafaxine XR (EFFEXOR-XR) 75 MG 24 hr capsule Take 75 mg by mouth daily with breakfast.  01/07/15 01/07/16  [provider]    Allergies Bactrim [sulfamethoxazole-trimethoprim]; Ciprofloxacin; and Celebrex [celecoxib]  Family History  Problem Relation Age of Onset  . Breast cancer Mother 9  . Breast cancer Paternal Aunt   . Breast cancer Paternal Aunt     Social History Social History   Tobacco Use  . Smoking status: Never Smoker  . Smokeless tobacco: Never Used  Substance Use Topics  . Alcohol  use: Yes    Alcohol/week: 0.0 standard drinks    Comment: rare  . Drug use: No     Review of Systems  Constitutional: No fever/chills Eyes: No visual changes. No discharge ENT: No upper respiratory complaints. Cardiovascular: no chest pain. Respiratory: no cough. No SOB. Gastrointestinal: No abdominal pain.  No nausea, no vomiting.  No diarrhea.  No constipation. Musculoskeletal: Patient has low back pain.  Skin: Negative for rash, abrasions, lacerations, ecchymosis. Neurological: Negative for headaches, focal weakness or  numbness.   ____________________________________________   PHYSICAL EXAM:  VITAL SIGNS: ED Triage Vitals  Enc Vitals Group     BP 01/15/18 1914 (!) 161/109     Pulse Rate 01/15/18 1914 97     Resp 01/15/18 1914 17     Temp 01/15/18 1914 98.1 F (36.7 C)     Temp Source 01/15/18 1914 Oral     SpO2 01/15/18 1914 97 %     Weight 01/15/18 1915 120 lb (54.4 kg)     Height 01/15/18 1915 4\' 10"  (1.473 m)     Head Circumference --      Peak Flow --      Pain Score 01/15/18 1915 10     Pain Loc --      Pain Edu? --      Excl. in [place]? --      Constitutional: Alert and oriented. Well appearing and in no acute distress. Eyes: Conjunctivae are normal. PERRL. EOMI. Head: Atraumatic. ENT:      Ears: TMs are pearly.      Nose: No congestion/rhinnorhea.      Mouth/Throat: Mucous membranes are moist.  Neck: No stridor.  No cervical spine tenderness to palpation. Hematological/Lymphatic/Immunilogical: No cervical lymphadenopathy. Cardiovascular: Normal rate, regular rhythm. Normal S1 and S2.  Good peripheral circulation. Respiratory: Normal respiratory effort without tachypnea or retractions. Lungs CTAB. Good air entry to the bases with no decreased or absent breath sounds. Gastrointestinal: Bowel sounds 4 quadrants. Soft and nontender to palpation. No guarding or rigidity. No palpable masses. No distention. No CVA tenderness. Musculoskeletal: Diffuse paraspinal muscle tenderness along the lumbar spine.  Positive straight leg raise, right. Neurologic:  Normal speech and language. No gross focal neurologic deficits are appreciated.  Skin:  Skin is warm, dry and intact. No rash noted. Psychiatric: Mood and affect are normal. Speech and behavior are normal. Patient exhibits appropriate insight and judgement.   ____________________________________________   LABS (all labs ordered are listed, but only abnormal results are displayed)  Labs Reviewed - No data to  display ____________________________________________  EKG   ____________________________________________  RADIOLOGY  No results found.  ____________________________________________    PROCEDURES  Procedure(s) performed:    Procedures    Medications  HYDROmorphone (DILAUDID) injection 1 mg (1 mg Intramuscular Given 01/15/18 2131)     ____________________________________________   INITIAL IMPRESSION / ASSESSMENT AND PLAN / ED COURSE  Pertinent labs & imaging results that were available during my care of the patient were reviewed by me and considered in my medical decision making (see chart for details).  Review of the [place] CSRS was performed in accordance of the [place] prior to dispensing any controlled drugs.      Assessment and plan Low back pain Patient was seen in the emergency department on 01/11/2017 for similar symptoms.  I consulted the [facility] drug database and patient has received no other chronic pain medications other than methadone from primary care.  Patient was given 1 mg dose of Dilaudid  as given 1 year ago for severe, acute on chronic low back pain with right lower extremity radiculopathy.  Patient was also discharged with tapered prednisone.  She was advised to follow-up with primary care regarding her pain management needs.  All patient questions were answered.    ____________________________________________  FINAL CLINICAL IMPRESSION(S) / ED DIAGNOSES  Final diagnoses:  Acute right-sided low back pain with right-sided sciatica      NEW MEDICATIONS STARTED DURING THIS VISIT:  ED Discharge Orders         Ordered    predniSONE (STERAPRED UNI-PAK 21 TAB) 10 MG (21) TBPK tablet     01/15/18 2127              This chart was dictated using voice recognition software/Dragon. Despite best efforts to proofread, errors can occur which can change the meaning. Any change was purely unintentional.    Lannie Fields, PA-C 01/15/18  2320    Carrie Mew, MD 01/16/18 [phone]

## 2018-01-15 NOTE — ED Triage Notes (Signed)
Patient has chronic back pain with hx of 3 back surgeries and has been hurting the last week but felt a little better today and cleaned her bathroom floor and then was unable to get back up. Patient already takes muscle relaxers and pain medication but not touching the pain today.

## 2018-01-15 NOTE — ED Notes (Signed)
Pt ambulatory to POV in wheelchair with her son, She was able to ambulate and bear weight. VSS. NAD. Discharge instructions, RX and follow up reviewed. All questions reviewed.

## 2018-02-16 ENCOUNTER — Telehealth: Payer: Self-pay

## 2018-02-16 ENCOUNTER — Other Ambulatory Visit: Payer: Self-pay

## 2018-02-16 MED ORDER — OSPEMIFENE 60 MG PO TABS: 1.0000 | ORAL_TABLET | Freq: Every day | ORAL | 11 refills | Status: DC

## 2018-02-16 NOTE — Telephone Encounter (Signed)
Done

## 2018-02-16 NOTE — Telephone Encounter (Signed)
Pt wants to switch pharmacies from [place] to [facility].  Pt needs refill of osphena 60mg .  [phone].

## 2018-04-12 ENCOUNTER — Other Ambulatory Visit: Payer: Self-pay | Admitting: Family Medicine

## 2018-04-12 DIAGNOSIS — Z1231 Encounter for screening mammogram for malignant neoplasm of breast: Secondary | ICD-10-CM

## 2018-04-23 ENCOUNTER — Ambulatory Visit (INDEPENDENT_AMBULATORY_CARE_PROVIDER_SITE_OTHER): Payer: Medicare Other | Admitting: Obstetrics & Gynecology

## 2018-04-23 ENCOUNTER — Encounter: Payer: Self-pay | Admitting: Obstetrics & Gynecology

## 2018-04-23 VITALS — BP 140/80 | Ht <= 58 in | Wt 132.0 lb

## 2018-04-23 DIAGNOSIS — Z01419 Encounter for gynecological examination (general) (routine) without abnormal findings: Secondary | ICD-10-CM | POA: Diagnosis not present

## 2018-04-23 DIAGNOSIS — N952 Postmenopausal atrophic vaginitis: Secondary | ICD-10-CM | POA: Diagnosis not present

## 2018-04-23 DIAGNOSIS — Z1239 Encounter for other screening for malignant neoplasm of breast: Secondary | ICD-10-CM | POA: Diagnosis not present

## 2018-04-23 DIAGNOSIS — Z1211 Encounter for screening for malignant neoplasm of colon: Secondary | ICD-10-CM

## 2018-04-23 MED ORDER — OSPEMIFENE 60 MG PO TABS: 1.0000 | ORAL_TABLET | Freq: Every day | ORAL | 3 refills | Status: DC

## 2018-04-23 NOTE — Progress Notes (Signed)
HPI:      [name] is a 63 y.o. G2P0011 who LMP was in the past, she presents today for her annual examination.  The patient has no complaints today, although she has occas post coital spotting (new partner); she uses Osphena daily and it has helped w dryness and pain. The patient is sexually active. Herlast pap: approximate date 2017 and was normal and last mammogram: approximate date 2018 and was normal.  The patient does perform self breast exams.  There is notable family history of breast or ovarian cancer in her family. The patient is not taking hormone replacement therapy. Patient denies post-menopausal vaginal bleeding.   The patient has regular exercise: yes. The patient denies current symptoms of depression.    GYN Hx: Last Colonoscopy:3 years ago. Normal.  Last DEXA: never ago.    PMHx: Past Medical History:  Diagnosis Date  . Anxiety   . Arthritis   . Chronic back pain    Past Surgical History:  Procedure Laterality Date  . ABDOMINAL HYSTERECTOMY    . BACK SURGERY    . BREAST BIOPSY Bilateral yrs ago   benign  . BREAST EXCISIONAL BIOPSY Bilateral yrs ago   benign  . COLONOSCOPY WITH PROPOFOL N/A 04/14/2015   Procedure: COLONOSCOPY WITH PROPOFOL;  Surgeon: Robert Bellow, MD;  Location: [facility] ENDOSCOPY;  Service: Endoscopy;  Laterality: N/A;   Family History  Problem Relation Age of Onset  . Breast cancer Mother 80  . Breast cancer Paternal Aunt   . Breast cancer Paternal Aunt    Social History   Tobacco Use  . Smoking status: Never Smoker  . Smokeless tobacco: Never Used  Substance Use Topics  . Alcohol use: Yes    Alcohol/week: 0.0 standard drinks    Comment: rare  . Drug use: No    Current Outpatient Medications:  .  cyclobenzaprine (FLEXERIL) 10 MG tablet, Take 10 mg by mouth 3 (three) times daily. , Disp: , Rfl:  .  meclizine (ANTIVERT) 25 MG tablet, Take 25 mg by mouth 3 (three) times daily as needed for dizziness., Disp: , Rfl:  .   methadone (DOLOPHINE) 5 MG tablet, Take 5 mg by mouth daily. , Disp: , Rfl:  .  nitrofurantoin, macrocrystal-monohydrate, (MACROBID) 100 MG capsule, Take 1 capsule (100 mg total) by mouth as needed., Disp: 30 capsule, Rfl: 5 .  Ospemifene (OSPHENA) 60 MG TABS, Take 1 tablet by mouth daily., Disp: 90 tablet, Rfl: 3 .  metoCLOPramide (REGLAN) 10 MG tablet, Take 1 tablet (10 mg total) by mouth as directed. 1 tabs 30 minutes before beginning prep and 4 hr later if needed for nausea., Disp: 2 tablet, Rfl: 0 .  traZODone (DESYREL) 50 MG tablet, Take 50 mg by mouth at bedtime., Disp: , Rfl:  .  venlafaxine XR (EFFEXOR-XR) 75 MG 24 hr capsule, Take 75 mg by mouth daily with breakfast. , Disp: , Rfl:  Allergies: Bactrim [sulfamethoxazole-trimethoprim]; Ciprofloxacin; Fluconazole; and Celebrex [celecoxib]  Review of Systems  Constitutional: Negative for chills, fever and malaise/fatigue.  HENT: Negative for congestion, sinus pain and sore throat.   Eyes: Negative for blurred vision and pain.  Respiratory: Negative for cough and wheezing.   Cardiovascular: Negative for chest pain and leg swelling.  Gastrointestinal: Negative for abdominal pain, constipation, diarrhea, heartburn, nausea and vomiting.  Genitourinary: Negative for dysuria, frequency, hematuria and urgency.  Musculoskeletal: Negative for back pain, joint pain, myalgias and neck pain.  Skin: Negative for itching and rash.  Neurological: Negative for dizziness, tremors and weakness.  Endo/Heme/Allergies: Does not bruise/bleed easily.  Psychiatric/Behavioral: Negative for depression. The patient is not nervous/anxious and does not have insomnia.     Objective: BP 140/80   Ht 4\' 10"  (1.473 m)   Wt 132 lb (59.9 kg)   BMI 27.59 kg/m   Filed Weights   04/23/18 1338  Weight: 132 lb (59.9 kg)   Body mass index is 27.59 kg/m. Physical Exam Constitutional:      General: She is not in acute distress.    Appearance: She is well-developed.    Genitourinary:     Pelvic exam was performed with patient supine.     Vagina and rectum normal.     No lesions in the vagina.     No vaginal bleeding.     No right or left adnexal mass present.     Right adnexa not tender.     Left adnexa not tender.     Genitourinary Comments: Absent Uterus Absent cervix Vaginal cuff well healed  HENT:     Head: Normocephalic and atraumatic. No laceration.     Right Ear: Hearing normal.     Left Ear: Hearing normal.     Mouth/Throat:     Pharynx: Uvula midline.  Eyes:     Pupils: Pupils are equal, round, and reactive to light.  Neck:     Musculoskeletal: Normal range of motion and neck supple.     Thyroid: No thyromegaly.  Cardiovascular:     Rate and Rhythm: Normal rate and regular rhythm.     Heart sounds: No murmur. No friction rub. No gallop.   Pulmonary:     Effort: Pulmonary effort is normal. No respiratory distress.     Breath sounds: Normal breath sounds. No wheezing.  Chest:     Breasts:        Right: No mass, skin change or tenderness.        Left: No mass, skin change or tenderness.  Abdominal:     General: Bowel sounds are normal. There is no distension.     Palpations: Abdomen is soft.     Tenderness: There is no abdominal tenderness. There is no rebound.  Musculoskeletal: Normal range of motion.  Neurological:     Mental Status: She is alert and oriented to person, place, and time.     Cranial Nerves: No cranial nerve deficit.  Skin:    General: Skin is warm and dry.  Psychiatric:        Judgment: Judgment normal.  Vitals signs reviewed.     Assessment: Annual Exam 1. Women's annual routine gynecological examination   2. Screening for breast cancer   3. Screen for colon cancer   4. Vaginal atrophy    Plan:            1.  Vaginal Screening-  Pap smear schedule reviewed with patient, every 5 years  2. Breast screening- Exam annually and mammogram scheduled  3. Colonoscopy every 10 years, Hemoccult testing  after age 35  4. Labs managed by PCP  5. Counseling for hormonal therapy: no change in therapy today Cont OSPHENA for vag dryness and dyspareunia tx As has had hysterectomy, the post coital spotting is more likely physical or activity induced, than any other risks.  Cont to monitor.  No estrogen desired as a form of therapy.    F/U  Return in about 1 year (around 04/24/2019) for Annual.  Barnett Applebaum, MD, [facility],  [facility] 04/23/2018  2:07 PM

## 2018-04-23 NOTE — Patient Instructions (Signed)
PAP every 5 years Mammogram every year    Call [phone] to schedule at [place] Colonoscopy every 10 years Labs yearly (with PCP) 

## 2018-05-22 ENCOUNTER — Ambulatory Visit
Admission: RE | Admit: 2018-05-22 | Discharge: 2018-05-22 | Disposition: A | Discharge status: Home / Self Care | Payer: Medicare Other | Source: Ambulatory Visit | Attending: Family Medicine | Admitting: Family Medicine

## 2018-05-22 DIAGNOSIS — Z1231 Encounter for screening mammogram for malignant neoplasm of breast: Secondary | ICD-10-CM

## 2018-11-15 ENCOUNTER — Emergency Department: Payer: Medicare Other

## 2018-11-15 ENCOUNTER — Other Ambulatory Visit: Payer: Self-pay

## 2018-11-15 ENCOUNTER — Emergency Department
Admission: EM | Admit: 2018-11-15 | Discharge: 2018-11-15 | Disposition: A | Discharge status: Home / Self Care | Payer: Medicare Other | Attending: Emergency Medicine | Admitting: Emergency Medicine

## 2018-11-15 DIAGNOSIS — Y9389 Activity, other specified: Secondary | ICD-10-CM | POA: Insufficient documentation

## 2018-11-15 DIAGNOSIS — M545 Low back pain: Secondary | ICD-10-CM | POA: Diagnosis present

## 2018-11-15 DIAGNOSIS — Z79899 Other long term (current) drug therapy: Secondary | ICD-10-CM | POA: Diagnosis not present

## 2018-11-15 DIAGNOSIS — Y92410 Unspecified street and highway as the place of occurrence of the external cause: Secondary | ICD-10-CM | POA: Insufficient documentation

## 2018-11-15 DIAGNOSIS — Y998 Other external cause status: Secondary | ICD-10-CM | POA: Insufficient documentation

## 2018-11-15 DIAGNOSIS — G8929 Other chronic pain: Secondary | ICD-10-CM

## 2018-11-15 MED ORDER — CYCLOBENZAPRINE HCL 10 MG PO TABS
10.0000 mg | ORAL_TABLET | Freq: Once | ORAL | Status: AC
  Administered 2018-11-15: 10 mg via ORAL
  Filled 2018-11-15: qty 1

## 2018-11-15 MED ORDER — OXYCODONE-ACETAMINOPHEN 5-325 MG PO TABS
1.0000 | ORAL_TABLET | Freq: Once | ORAL | Status: AC
  Administered 2018-11-15: 1 via ORAL
  Filled 2018-11-15: qty 1

## 2018-11-15 MED ORDER — PREDNISONE 10 MG (21) PO TBPK: ORAL_TABLET | ORAL | 0 refills | Status: DC

## 2018-11-15 MED ORDER — HYDROMORPHONE HCL 1 MG/ML IJ SOLN
1.0000 mg | Freq: Once | INTRAMUSCULAR | Status: AC
  Administered 2018-11-15: 1 mg via INTRAMUSCULAR
  Filled 2018-11-15: qty 1
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
## 2018-11-15 NOTE — Discharge Instructions (Signed)
Follow-up with your regular doctor.  Please call for an appointment.  Take the steroid and your regular medications as directed.  Return if worsening
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
## 2018-11-15 NOTE — ED Notes (Signed)
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
## 2018-12-05 ENCOUNTER — Other Ambulatory Visit: Payer: Self-pay | Admitting: Orthopedic Surgery

## 2018-12-05 DIAGNOSIS — M5442 Lumbago with sciatica, left side: Secondary | ICD-10-CM

## 2018-12-05 DIAGNOSIS — M4316 Spondylolisthesis, lumbar region: Secondary | ICD-10-CM

## 2018-12-18 ENCOUNTER — Other Ambulatory Visit: Payer: Self-pay

## 2018-12-18 ENCOUNTER — Ambulatory Visit
Admission: RE | Admit: 2018-12-18 | Discharge: 2018-12-18 | Disposition: A | Discharge status: Home / Self Care | Payer: Medicare Other | Source: Ambulatory Visit | Attending: Orthopedic Surgery | Admitting: Orthopedic Surgery

## 2018-12-18 DIAGNOSIS — M4316 Spondylolisthesis, lumbar region: Secondary | ICD-10-CM | POA: Diagnosis present

## 2018-12-18 DIAGNOSIS — M5442 Lumbago with sciatica, left side: Secondary | ICD-10-CM

## 2019-04-15 ENCOUNTER — Other Ambulatory Visit: Payer: Self-pay | Admitting: Family Medicine

## 2019-04-15 DIAGNOSIS — Z1231 Encounter for screening mammogram for malignant neoplasm of breast: Secondary | ICD-10-CM

## 2019-05-19 ENCOUNTER — Other Ambulatory Visit: Payer: Self-pay

## 2019-05-19 ENCOUNTER — Emergency Department
Admission: EM | Admit: 2019-05-19 | Discharge: 2019-05-19 | Disposition: A | Discharge status: Home / Self Care | Payer: Medicare Other | Attending: Emergency Medicine | Admitting: Emergency Medicine

## 2019-05-19 ENCOUNTER — Encounter: Payer: Self-pay | Admitting: Emergency Medicine

## 2019-05-19 DIAGNOSIS — M5442 Lumbago with sciatica, left side: Secondary | ICD-10-CM | POA: Insufficient documentation

## 2019-05-19 DIAGNOSIS — Z79899 Other long term (current) drug therapy: Secondary | ICD-10-CM | POA: Insufficient documentation

## 2019-05-19 DIAGNOSIS — G8929 Other chronic pain: Secondary | ICD-10-CM

## 2019-05-19 DIAGNOSIS — M5489 Other dorsalgia: Secondary | ICD-10-CM | POA: Diagnosis present

## 2019-05-19 MED ORDER — PREDNISONE 20 MG PO TABS: ORAL_TABLET | ORAL | 0 refills | Status: DC

## 2019-05-19 MED ORDER — HYDROMORPHONE HCL 1 MG/ML IJ SOLN
1.0000 mg | Freq: Once | INTRAMUSCULAR | Status: AC
  Administered 2019-05-19: 13:00:00 1 mg via INTRAMUSCULAR
  Filled 2019-05-19: qty 1
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
## 2019-05-19 NOTE — Discharge Instructions (Addendum)
Follow-up with Drs. Bronstein or Chasnis for further management. Take the steroid taper as directed.
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
## 2019-05-27 ENCOUNTER — Ambulatory Visit
Admission: RE | Admit: 2019-05-27 | Discharge: 2019-05-27 | Disposition: A | Discharge status: Home / Self Care | Payer: Medicare Other | Source: Ambulatory Visit | Attending: Family Medicine | Admitting: Family Medicine

## 2019-05-27 DIAGNOSIS — Z1231 Encounter for screening mammogram for malignant neoplasm of breast: Secondary | ICD-10-CM | POA: Diagnosis present

## 2019-09-11 ENCOUNTER — Other Ambulatory Visit: Payer: Self-pay | Admitting: Obstetrics & Gynecology

## 2019-09-12 NOTE — Telephone Encounter (Signed)
Patient is schedule for 10/09/19 with [facility]

## 2019-09-12 NOTE — Telephone Encounter (Signed)
Sch Annual

## 2019-10-03 ENCOUNTER — Other Ambulatory Visit: Payer: Self-pay

## 2019-10-03 ENCOUNTER — Encounter: Payer: Self-pay | Admitting: Emergency Medicine

## 2019-10-03 ENCOUNTER — Emergency Department
Admission: EM | Admit: 2019-10-03 | Discharge: 2019-10-03 | Disposition: A | Discharge status: Home / Self Care | Payer: Medicare Other | Attending: Emergency Medicine | Admitting: Emergency Medicine

## 2019-10-03 DIAGNOSIS — G8929 Other chronic pain: Secondary | ICD-10-CM

## 2019-10-03 DIAGNOSIS — M5442 Lumbago with sciatica, left side: Secondary | ICD-10-CM | POA: Diagnosis not present

## 2019-10-03 DIAGNOSIS — Z7952 Long term (current) use of systemic steroids: Secondary | ICD-10-CM | POA: Insufficient documentation

## 2019-10-03 DIAGNOSIS — M545 Low back pain: Secondary | ICD-10-CM | POA: Diagnosis present

## 2019-10-03 DIAGNOSIS — M5441 Lumbago with sciatica, right side: Secondary | ICD-10-CM | POA: Insufficient documentation

## 2019-10-03 MED ORDER — PREDNISONE 10 MG PO TABS: ORAL_TABLET | ORAL | 0 refills | Status: DC

## 2019-10-03 MED ORDER — HYDROMORPHONE HCL 1 MG/ML IJ SOLN
1.0000 mg | Freq: Once | INTRAMUSCULAR | Status: AC
  Administered 2019-10-03: 1 mg via INTRAMUSCULAR
  Filled 2019-10-03: qty 1

## 2019-10-03 NOTE — Discharge Instructions (Signed)
Follow-up with your doctor if any continued problems.  Continue taking your regular medication at home.  You were given an injection while in the ED for pain which is the same medication that you have gotten in the past.  Also prescription for prednisone to taper over the next 6 days was sent to your pharmacy.  Use ice or heat to your back as needed for discomfort.  Return to the emergency department over the weekend if any severe worsening such as incontinence of bowel or bladder or inability to walk.
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
## 2019-10-03 NOTE — ED Notes (Signed)
See triage note  Presents with lower back pain which is moving into both legs  States she has hx of same   States she has pain daily  But this is increased

## 2019-10-03 NOTE — ED Provider Notes (Signed)
[facility] Emergency Department Provider Note  ____________________________________________   First MD Initiated Contact with Patient 10/03/19 1231     (approximate)  I have reviewed the triage vital signs and the nursing notes.   HISTORY  Chief Complaint Back Pain and Leg Pain   HPI Sylvia Cole is a 64 y.o. female presents to the ED with complaint of exacerbation of her chronic low back pain and sciatica to bilateral lower extremities.  Patient has a history of the same.  She currently is taking medication that has been prescribed by her doctor including methadone and Flexeril but occasionally she comes to the ED for her "cocktail".  She denies any recent injury, urinary symptoms, incontinence of bowel or bladder.  Patient continues to ambulate without any assistance.  Currently she rates her pain as a 10/10.       Past Medical History:  Diagnosis Date  . Anxiety   . Arthritis   . Chronic back pain     Patient Active Problem List   Diagnosis Date Noted  . Skin tag 05/12/2017  . Recurrent UTI 10/03/2016  . Vaginal atrophy 10/03/2016  . Skin lesion of breast 10/20/2015    Past Surgical History:  Procedure Laterality Date  . ABDOMINAL HYSTERECTOMY    . BACK SURGERY    . BREAST BIOPSY Bilateral yrs ago   benign  . BREAST EXCISIONAL BIOPSY Bilateral yrs ago   benign  . COLONOSCOPY WITH PROPOFOL N/A 04/14/2015   Procedure: COLONOSCOPY WITH PROPOFOL;  Surgeon: Robert Bellow, MD;  Location: [facility] ENDOSCOPY;  Service: Endoscopy;  Laterality: N/A;    Prior to Admission medications   Medication Sig Start Date End Date Taking? Authorizing Provider  cyclobenzaprine (FLEXERIL) 10 MG tablet Take 10 mg by mouth 3 (three) times daily as needed for muscle spasms.    [provider]  meclizine (ANTIVERT) 25 MG tablet Take 25 mg by mouth 3 (three) times daily as needed for dizziness.    [provider]  methadone (DOLOPHINE) 5 MG  tablet Take 5 mg by mouth daily.     [provider]  OSPHENA 60 MG TABS TAKE 1 TABLET BY MOUTH DAILY 09/12/19   Gae Dry, MD  predniSONE (DELTASONE) 10 MG tablet Take 6 tablets  today, on day 2 take 5 tablets, day 3 take 4 tablets, day 4 take 3 tablets, day 5 take  2 tablets and 1 tablet the last day 10/03/19   Johnn Hai, PA-C  traZODone (DESYREL) 50 MG tablet Take 50 mg by mouth at bedtime.    [provider]    Allergies Fluconazole, Bactrim [sulfamethoxazole-trimethoprim], Celebrex [celecoxib], and Ciprofloxacin  Family History  Problem Relation Age of Onset  . Breast cancer Mother 12  . Breast cancer Paternal Aunt   . Breast cancer Paternal Aunt     Social History Social History   Tobacco Use  . Smoking status: Never Smoker  . Smokeless tobacco: Never Used  Vaping Use  . Vaping Use: Never used  Substance Use Topics  . Alcohol use: Yes    Alcohol/week: 0.0 standard drinks  . Drug use: No    Review of Systems Constitutional: No fever/chills Eyes: No visual changes. ENT: No sore throat. Cardiovascular: Denies chest pain. Respiratory: Denies shortness of breath. Gastrointestinal: No abdominal pain.  No nausea, no vomiting.  No diarrhea.  No constipation. Genitourinary: Negative for dysuria. Musculoskeletal: Positive chronic back pain. Skin: Negative for rash. Neurological: Negative for headaches,  focal weakness or numbness.   ____________________________________________   PHYSICAL EXAM:  VITAL SIGNS: ED Triage Vitals  Enc Vitals Group     BP 10/03/19 1148 120/77     Pulse Rate 10/03/19 1148 60     Resp 10/03/19 1148 16     Temp 10/03/19 1148 98.2 F (36.8 C)     Temp Source 10/03/19 1148 Oral     SpO2 10/03/19 1148 99 %     Weight 10/03/19 1149 139 lb (63 kg)     Height 10/03/19 1149 4\' 10"  (1.473 m)     Head Circumference --      Peak Flow --      Pain Score 10/03/19 1154 10     Pain Loc --      Pain Edu? --      Excl.  in [place]? --     Constitutional: Alert and oriented. Well appearing and in no acute distress. Eyes: Conjunctivae are normal.  Head: Atraumatic. Neck: No stridor.   Cardiovascular: Normal rate, regular rhythm. Grossly normal heart sounds.  Good peripheral circulation. Respiratory: Normal respiratory effort.  No retractions. Lungs CTAB. Gastrointestinal: Soft and nontender. No distention. Musculoskeletal: No gross deformities noted of the lower back however there is generalized tenderness on palpation.  Patient is able to stand and ambulate without any assistance.  Good muscle strength bilaterally.  No point tenderness on palpation of the lumbar spine and no step-offs were noted. Neurologic:  Normal speech and language. No gross focal neurologic deficits are appreciated. No gait instability. Skin:  Skin is warm, dry and intact. No rash noted. Psychiatric: Mood and affect are normal. Speech and behavior are normal.  ____________________________________________   LABS (all labs ordered are listed, but only abnormal results are displayed)  Labs Reviewed - No data to display  PROCEDURES  Procedure(s) performed (including Critical Care):  Procedures   ____________________________________________   INITIAL IMPRESSION / ASSESSMENT AND PLAN / ED COURSE  As part of my medical decision making, I reviewed the following data within the electronic MEDICAL RECORD NUMBER Notes from prior ED visits and  Controlled Substance Database  64 year old female presents to the ED with complaint of chronic back pain for which she already sees her PCP and has medication for this she states that once or twice a year her pain is not controlled by her routine medication and she comes to the ED for a "cocktail" and then resumes her regular medication.  In looking through her records this happens infrequently.  Patient was given Dilaudid 1 mg IM and a tapering dose of prednisone starting at 60 mg.  Patient will follow  up with her PCP if any continued problems and encouraged to use ice or heat to her back.  She will resume taking her regular medications at home.   ____________________________________________   FINAL CLINICAL IMPRESSION(S) / ED DIAGNOSES  Final diagnoses:  Chronic bilateral low back pain with bilateral sciatica     ED Discharge Orders         Ordered    predniSONE (DELTASONE) 10 MG tablet     Discontinue  Reprint     10/03/19 1325           Note:  This document was prepared using Dragon voice recognition software and may include unintentional dictation errors.    Johnn Hai, PA-C 10/03/19 1532    Vanessa Yamhill, MD 10/04/19 0830

## 2019-10-09 ENCOUNTER — Ambulatory Visit (INDEPENDENT_AMBULATORY_CARE_PROVIDER_SITE_OTHER): Payer: Medicare Other | Admitting: Obstetrics & Gynecology

## 2019-10-09 ENCOUNTER — Other Ambulatory Visit: Payer: Self-pay

## 2019-10-09 ENCOUNTER — Encounter: Payer: Self-pay | Admitting: Obstetrics & Gynecology

## 2019-10-09 VITALS — BP 130/80 | Ht <= 58 in | Wt 140.0 lb

## 2019-10-09 DIAGNOSIS — Z1231 Encounter for screening mammogram for malignant neoplasm of breast: Secondary | ICD-10-CM | POA: Diagnosis not present

## 2019-10-09 DIAGNOSIS — Z1211 Encounter for screening for malignant neoplasm of colon: Secondary | ICD-10-CM

## 2019-10-09 DIAGNOSIS — N952 Postmenopausal atrophic vaginitis: Secondary | ICD-10-CM | POA: Diagnosis not present

## 2019-10-09 DIAGNOSIS — Z01419 Encounter for gynecological examination (general) (routine) without abnormal findings: Secondary | ICD-10-CM | POA: Diagnosis not present

## 2019-10-09 MED ORDER — OSPHENA 60 MG PO TABS: 1.0000 | ORAL_TABLET | Freq: Every day | ORAL | 3 refills | Status: DC

## 2019-10-09 NOTE — Patient Instructions (Addendum)
PAP every 5 years Mammogram every year    Call [phone] to schedule at [facility] in [place] Colonoscopy every 10 years Labs yearly (with PCP)  Thank you for choosing [facility]. As part of our ongoing efforts to improve patient experience, we would appreciate your feedback. Please fill out the short survey that you will receive by mail or MyChart. Your opinion is important to [place]!

## 2019-10-09 NOTE — Progress Notes (Signed)
HPI:      [name] is a 64 y.o. G2P0011 who LMP was in the past, she presents today for her annual examination.  The patient has no complaints today. The patient is sexually active. Herlast pap: approximate date 2017 and was normal and last mammogram: approximate date 2021 and was normal.  The patient does perform self breast exams.  There is no notable family history of breast or ovarian cancer in her family. The patient is not taking hormone replacement therapy. SHe takes [facility] for vag dryness and dyspareunia that helps tremendously.  Patient denies post-menopausal vaginal bleeding.   The patient has regular exercise: yes. The patient denies current symptoms of depression.    GYN Hx: Last Colonoscopy:4 years ago. Normal.  Last DEXA: never ago.    PMHx: Past Medical History:  Diagnosis Date  . Anxiety   . Arthritis   . Chronic back pain    Past Surgical History:  Procedure Laterality Date  . ABDOMINAL HYSTERECTOMY    . BACK SURGERY    . BREAST BIOPSY Bilateral yrs ago   benign  . BREAST EXCISIONAL BIOPSY Bilateral yrs ago   benign  . COLONOSCOPY WITH PROPOFOL N/A 04/14/2015   Procedure: COLONOSCOPY WITH PROPOFOL;  Surgeon: Finleigh Cheong Bellow, MD;  Location: [facility] ENDOSCOPY;  Service: Endoscopy;  Laterality: N/A;   Family History  Problem Relation Age of Onset  . Breast cancer Mother 53  . Breast cancer Paternal Aunt   . Breast cancer Paternal Aunt    Social History   Tobacco Use  . Smoking status: Never Smoker  . Smokeless tobacco: Never Used  Vaping Use  . Vaping Use: Never used  Substance Use Topics  . Alcohol use: Yes    Alcohol/week: 0.0 standard drinks  . Drug use: No    Current Outpatient Medications:  .  cyclobenzaprine (FLEXERIL) 10 MG tablet, Take 10 mg by mouth 3 (three) times daily as needed for muscle spasms., Disp: , Rfl:  .  meclizine (ANTIVERT) 25 MG tablet, Take 25 mg by mouth 3 (three) times daily as needed for dizziness., Disp: , Rfl:  .   methadone (DOLOPHINE) 5 MG tablet, Take 5 mg by mouth daily. , Disp: , Rfl:  .  metoprolol succinate (TOPROL-XL) 50 MG 24 hr tablet, Take 50 mg by mouth daily., Disp: , Rfl:  .  Ospemifene (OSPHENA) 60 MG TABS, Take 1 tablet by mouth daily., Disp: 90 tablet, Rfl: 3 .  traZODone (DESYREL) 50 MG tablet, Take 50 mg by mouth at bedtime., Disp: , Rfl:  Allergies: Fluconazole, Bactrim [sulfamethoxazole-trimethoprim], Celebrex [celecoxib], and Ciprofloxacin  Review of Systems  Constitutional: Negative for chills, fever and malaise/fatigue.  HENT: Negative for congestion, sinus pain and sore throat.   Eyes: Negative for blurred vision and pain.  Respiratory: Negative for cough and wheezing.   Cardiovascular: Negative for chest pain and leg swelling.  Gastrointestinal: Negative for abdominal pain, constipation, diarrhea, heartburn, nausea and vomiting.  Genitourinary: Negative for dysuria, frequency, hematuria and urgency.  Musculoskeletal: Negative for back pain, joint pain, myalgias and neck pain.  Skin: Negative for itching and rash.  Neurological: Negative for dizziness, tremors and weakness.  Endo/Heme/Allergies: Does not bruise/bleed easily.  Psychiatric/Behavioral: Negative for depression. The patient is not nervous/anxious and does not have insomnia.     Objective: BP 130/80   Ht 4\' 10"  (1.473 m)   Wt 140 lb (63.5 kg)   BMI 29.26 kg/m   Filed Weights   10/09/19 1418  Weight: 140 lb (63.5 kg)   Body mass index is 29.26 kg/m. Physical Exam Constitutional:      General: She is not in acute distress.    Appearance: She is well-developed.  Genitourinary:     Pelvic exam was performed with patient supine.     Vagina and rectum normal.     No lesions in the vagina.     No vaginal bleeding.     No right or left adnexal mass present.     Right adnexa not tender.     Left adnexa not tender.     Genitourinary Comments: Absent Uterus Absent cervix Vaginal cuff well healed Atrophy  noted  HENT:     Head: Normocephalic and atraumatic. No laceration.     Right Ear: Hearing normal.     Left Ear: Hearing normal.     Mouth/Throat:     Pharynx: Uvula midline.  Eyes:     Pupils: Pupils are equal, round, and reactive to light.  Neck:     Thyroid: No thyromegaly.  Cardiovascular:     Rate and Rhythm: Normal rate and regular rhythm.     Heart sounds: No murmur heard.  No friction rub. No gallop.   Pulmonary:     Effort: Pulmonary effort is normal. No respiratory distress.     Breath sounds: Normal breath sounds. No wheezing.  Chest:     Breasts:        Right: No mass, skin change or tenderness.        Left: No mass, skin change or tenderness.  Abdominal:     General: Bowel sounds are normal. There is no distension.     Palpations: Abdomen is soft.     Tenderness: There is no abdominal tenderness. There is no rebound.  Musculoskeletal:        General: Normal range of motion.     Cervical back: Normal range of motion and neck supple.  Neurological:     Mental Status: She is alert and oriented to person, place, and time.     Cranial Nerves: No cranial nerve deficit.  Skin:    General: Skin is warm and dry.  Psychiatric:        Judgment: Judgment normal.  Vitals reviewed.     Assessment: Annual Exam 1. Women's annual routine gynecological examination   2. Vaginal atrophy   3. Screen for colon cancer   4. Encounter for screening mammogram for malignant neoplasm of breast     Plan:            1.  Cervical Screening-  Pap smear schedule reviewed with patient  2. Breast screening- Exam annually and mammogram scheduled  3. Colonoscopy every 10 years, Hemoccult testing after age 35  4. Labs managed by PCP  5. Counseling for hormonal therapy: none Osphena for vag atrophy going well              6. FRAX - FRAX score for assessing the 10 year probability for fracture calculated and discussed today.  Based on age and score today, DEXA is not currently  scheduled.    F/U  Return in about 1 year (around 10/08/2020) for Annual.  Barnett Applebaum, MD, [facility], [facility] 10/09/2019  3:02 PM

## 2019-10-14 ENCOUNTER — Other Ambulatory Visit: Payer: Self-pay | Admitting: Obstetrics & Gynecology

## 2019-10-14 DIAGNOSIS — N952 Postmenopausal atrophic vaginitis: Secondary | ICD-10-CM

## 2019-12-18 ENCOUNTER — Other Ambulatory Visit: Payer: Self-pay

## 2019-12-18 ENCOUNTER — Encounter: Payer: Self-pay | Admitting: Ophthalmology

## 2019-12-23 ENCOUNTER — Other Ambulatory Visit
Admission: RE | Admit: 2019-12-23 | Discharge: 2019-12-23 | Disposition: A | Discharge status: Home / Self Care | Payer: Medicare Other | Source: Ambulatory Visit | Attending: Ophthalmology | Admitting: Ophthalmology

## 2019-12-23 ENCOUNTER — Other Ambulatory Visit: Payer: Self-pay

## 2019-12-23 DIAGNOSIS — Z20822 Contact with and (suspected) exposure to covid-19: Secondary | ICD-10-CM | POA: Diagnosis not present

## 2019-12-23 DIAGNOSIS — Z01812 Encounter for preprocedural laboratory examination: Secondary | ICD-10-CM | POA: Diagnosis present

## 2019-12-23 NOTE — Discharge Instructions (Signed)

## 2019-12-24 LAB — SARS CORONAVIRUS 2 (TAT 6-24 HRS): SARS Coronavirus 2: NEGATIVE

## 2019-12-25 ENCOUNTER — Other Ambulatory Visit: Payer: Self-pay

## 2019-12-25 ENCOUNTER — Encounter: Payer: Self-pay | Admitting: Ophthalmology

## 2019-12-25 ENCOUNTER — Ambulatory Visit
Admission: RE | Admit: 2019-12-25 | Discharge: 2019-12-25 | Disposition: A | Discharge status: Home / Self Care | Payer: Medicare Other | Attending: Ophthalmology | Admitting: Ophthalmology

## 2019-12-25 ENCOUNTER — Ambulatory Visit: Payer: Medicare Other | Admitting: Anesthesiology

## 2019-12-25 ENCOUNTER — Encounter
Admission: RE | Disposition: A | Discharge status: Home / Self Care | Payer: Self-pay | Source: Home / Self Care | Attending: Ophthalmology

## 2019-12-25 DIAGNOSIS — Z79899 Other long term (current) drug therapy: Secondary | ICD-10-CM | POA: Diagnosis not present

## 2019-12-25 DIAGNOSIS — I10 Essential (primary) hypertension: Secondary | ICD-10-CM | POA: Insufficient documentation

## 2019-12-25 DIAGNOSIS — M1990 Unspecified osteoarthritis, unspecified site: Secondary | ICD-10-CM | POA: Insufficient documentation

## 2019-12-25 DIAGNOSIS — H2511 Age-related nuclear cataract, right eye: Secondary | ICD-10-CM | POA: Diagnosis not present

## 2019-12-25 DIAGNOSIS — G8929 Other chronic pain: Secondary | ICD-10-CM | POA: Diagnosis not present

## 2019-12-25 HISTORY — DX: Presence of dental prosthetic device (complete) (partial): Z97.2

## 2019-12-25 HISTORY — PX: CATARACT EXTRACTION W/PHACO: SHX586

## 2019-12-25 SURGERY — PHACOEMULSIFICATION, CATARACT, WITH IOL INSERTION
Anesthesia: Monitor Anesthesia Care | Site: Eye | Laterality: Right

## 2019-12-25 MED ORDER — MIDAZOLAM HCL 2 MG/2ML IJ SOLN
INTRAMUSCULAR | Status: DC | PRN
  Administered 2019-12-25: 2 mg via INTRAVENOUS

## 2019-12-25 MED ORDER — NA HYALUR & NA CHOND-NA HYALUR 0.4-0.35 ML IO KIT
PACK | INTRAOCULAR | Status: DC | PRN
  Administered 2019-12-25: 1 mL via INTRAOCULAR

## 2019-12-25 MED ORDER — FENTANYL CITRATE (PF) 100 MCG/2ML IJ SOLN
INTRAMUSCULAR | Status: DC | PRN
Start: 2019-12-25 — End: 2019-12-25
  Administered 2019-12-25: 100 ug via INTRAVENOUS

## 2019-12-25 MED ORDER — TETRACAINE HCL 0.5 % OP SOLN
1.0000 [drp] | OPHTHALMIC | Status: DC | PRN
  Administered 2019-12-25 (×3): 1 [drp] via OPHTHALMIC

## 2019-12-25 MED ORDER — ACETAMINOPHEN 325 MG PO TABS: 325.0000 mg | ORAL_TABLET | ORAL | Status: DC | PRN

## 2019-12-25 MED ORDER — LIDOCAINE HCL (PF) 2 % IJ SOLN
INTRAOCULAR | Status: DC | PRN
  Administered 2019-12-25: 2 mL

## 2019-12-25 MED ORDER — ARMC OPHTHALMIC DILATING DROPS
1.0000 "application " | OPHTHALMIC | Status: DC | PRN
  Administered 2019-12-25 (×3): 1 via OPHTHALMIC

## 2019-12-25 MED ORDER — EPINEPHRINE PF 1 MG/ML IJ SOLN
INTRAOCULAR | Status: DC | PRN
  Administered 2019-12-25: 74 mL via OPHTHALMIC

## 2019-12-25 MED ORDER — BRIMONIDINE TARTRATE-TIMOLOL 0.2-0.5 % OP SOLN
OPHTHALMIC | Status: DC | PRN
  Administered 2019-12-25: 1 [drp] via OPHTHALMIC

## 2019-12-25 MED ORDER — CEFUROXIME OPHTHALMIC INJECTION 1 MG/0.1 ML
INJECTION | OPHTHALMIC | Status: DC | PRN
  Administered 2019-12-25: 0.1 mL via INTRACAMERAL

## 2019-12-25 MED ORDER — MOXIFLOXACIN HCL 0.5 % OP SOLN
1.0000 [drp] | OPHTHALMIC | Status: DC | PRN
  Administered 2019-12-25 (×3): 1 [drp] via OPHTHALMIC

## 2019-12-25 MED ORDER — LACTATED RINGERS IV SOLN: INTRAVENOUS | Status: DC

## 2019-12-25 MED ORDER — ACETAMINOPHEN 160 MG/5ML PO SOLN: 325.0000 mg | ORAL | Status: DC | PRN

## 2019-12-25 SURGICAL SUPPLY — 23 items
CANNULA ANT/CHMB 27G (MISCELLANEOUS) ×1 IMPLANT
CANNULA ANT/CHMB 27GA (MISCELLANEOUS) ×2 IMPLANT
GLOVE SURG LX 7.5 STRW (GLOVE) ×1
GLOVE SURG LX STRL 7.5 STRW (GLOVE) ×1 IMPLANT
GLOVE SURG TRIUMPH 8.0 PF LTX (GLOVE) ×2 IMPLANT
GOWN STRL REUS W/ TWL LRG LVL3 (GOWN DISPOSABLE) ×2 IMPLANT
GOWN STRL REUS W/TWL LRG LVL3 (GOWN DISPOSABLE) ×4
LENS IOL DIOP 26.5 (Intraocular Lens) ×2 IMPLANT
LENS IOL TECNIS MONO 26.5 (Intraocular Lens) IMPLANT
MARKER SKIN DUAL TIP RULER LAB (MISCELLANEOUS) ×2 IMPLANT
NDL CAPSULORHEX 25GA (NEEDLE) ×1 IMPLANT
NDL FILTER BLUNT 18X1 1/2 (NEEDLE) ×2 IMPLANT
NEEDLE CAPSULORHEX 25GA (NEEDLE) ×2 IMPLANT
NEEDLE FILTER BLUNT 18X 1/2SAF (NEEDLE) ×2
NEEDLE FILTER BLUNT 18X1 1/2 (NEEDLE) ×2 IMPLANT
PACK CATARACT BRASINGTON (MISCELLANEOUS) ×2 IMPLANT
PACK EYE AFTER SURG (MISCELLANEOUS) ×2 IMPLANT
PACK OPTHALMIC (MISCELLANEOUS) ×2 IMPLANT
SOLUTION OPHTHALMIC SALT (MISCELLANEOUS) ×2 IMPLANT
SYR 3ML LL SCALE MARK (SYRINGE) ×4 IMPLANT
SYR TB 1ML LUER SLIP (SYRINGE) ×2 IMPLANT
WATER STERILE IRR 250ML POUR (IV SOLUTION) ×2 IMPLANT
WIPE NON LINTING 3.25X3.25 (MISCELLANEOUS) ×2 IMPLANT

## 2019-12-25 NOTE — H&P (Signed)

## 2019-12-25 NOTE — Transfer of Care (Signed)
Immediate Anesthesia Transfer of Care Note  Patient: Sylvia Cole  Procedure(s) Performed: CATARACT EXTRACTION PHACO AND INTRAOCULAR LENS PLACEMENT (IOC) RIGHT 8.52 00:56.1 15.1% (Right Eye)  Patient Location: PACU  Anesthesia Type: MAC  Level of Consciousness: awake, alert  and patient cooperative  Airway and Oxygen Therapy: Patient Spontanous Breathing and Patient connected to supplemental oxygen  Post-op Assessment: Post-op Vital signs reviewed, Patient's Cardiovascular Status Stable, Respiratory Function Stable, Patent Airway and No signs of Nausea or vomiting  Post-op Vital Signs: Reviewed and stable  Complications: No complications documented.

## 2019-12-25 NOTE — Anesthesia Postprocedure Evaluation (Signed)
Anesthesia Post Note  Patient: Sylvia Cole  Procedure(s) Performed: CATARACT EXTRACTION PHACO AND INTRAOCULAR LENS PLACEMENT (IOC) RIGHT 8.52 00:56.1 15.1% (Right Eye)     Patient location during evaluation: PACU Anesthesia Type: MAC Level of consciousness: awake and alert Pain management: pain level controlled Vital Signs Assessment: post-procedure vital signs reviewed and stable Respiratory status: spontaneous breathing, nonlabored ventilation, respiratory function stable and patient connected to nasal cannula oxygen Cardiovascular status: stable and blood pressure returned to baseline Postop Assessment: no apparent nausea or vomiting Anesthetic complications: no   No complications documented.  Trecia Rogers

## 2019-12-25 NOTE — Anesthesia Preprocedure Evaluation (Signed)
Anesthesia Evaluation  Patient identified by MRN, date of birth, ID band Patient awake    Reviewed: Allergy & Precautions, H&P , NPO status , Patient's Chart, lab work & pertinent test results, reviewed documented beta blocker date and time   Airway Mallampati: II  TM Distance: >3 FB Neck ROM: full    Dental  (+) Lower Dentures Denture removed:   Pulmonary neg pulmonary ROS,    Pulmonary exam normal breath sounds clear to auscultation       Cardiovascular Exercise Tolerance: Good negative cardio ROS Normal cardiovascular exam Rhythm:regular Rate:Normal     Neuro/Psych Chronic back pain, on Methadone 15 mg/day negative psych ROS   GI/Hepatic negative GI ROS, Neg liver ROS,   Endo/Other  negative endocrine ROS  Renal/GU negative Renal ROS  negative genitourinary   Musculoskeletal   Abdominal   Peds  Hematology negative hematology ROS (+)   Anesthesia Other Findings   Reproductive/Obstetrics negative OB ROS                             Anesthesia Physical Anesthesia Plan  ASA: II  Anesthesia Plan: MAC   Post-op Pain Management:    Induction:   PONV Risk Score and Plan:   Airway Management Planned:   Additional Equipment:   Intra-op Plan:   Post-operative Plan:   Informed Consent: I have reviewed the patients History and Physical, chart, labs and discussed the procedure including the risks, benefits and alternatives for the proposed anesthesia with the patient or authorized representative who has indicated his/her understanding and acceptance.     Dental Advisory Given  Plan Discussed with: CRNA and Anesthesiologist  Anesthesia Plan Comments:         Anesthesia Quick Evaluation

## 2019-12-25 NOTE — Anesthesia Procedure Notes (Signed)
Procedure Name: MAC Date/Time: 12/25/2019 11:56 AM Performed by: Silvana Newness, CRNA Pre-anesthesia Checklist: Patient identified, Emergency Drugs available, Suction available, Patient being monitored and Timeout performed Patient Re-evaluated:Patient Re-evaluated prior to induction Oxygen Delivery Method: Nasal cannula Placement Confirmation: positive ETCO2

## 2019-12-25 NOTE — Op Note (Signed)
LOCATION:  [place]   PREOPERATIVE DIAGNOSIS:    Nuclear sclerotic cataract right eye. H25.11   POSTOPERATIVE DIAGNOSIS:  Nuclear sclerotic cataract right eye.     PROCEDURE:  Phacoemusification with posterior chamber intraocular lens placement of the right eye   ULTRASOUND TIME: Procedure(s): CATARACT EXTRACTION PHACO AND INTRAOCULAR LENS PLACEMENT (IOC) RIGHT 8.52 00:56.1 15.1% (Right)  LENS:   Implant Name Type Inv. Item Serial No. Manufacturer Lot No. LRB No. Used Action  LENS IOL DIOP 26.5 - [id number] Intraocular Lens LENS IOL DIOP 26.5 [id number] [id number]   Right 1 Implanted         SURGEON:  Wyonia Hough, MD   ANESTHESIA:  Topical with tetracaine drops and 2% Xylocaine jelly, augmented with 1% preservative-free intracameral lidocaine.    COMPLICATIONS:  None.   DESCRIPTION OF PROCEDURE:  The patient was identified in the holding room and transported to the operating room and placed in the supine position under the operating microscope.  The right eye was identified as the operative eye and it was prepped and draped in the usual sterile ophthalmic fashion.   A 1 millimeter clear-corneal paracentesis was made at the 12:00 position.  0.5 ml of preservative-free 1% lidocaine was injected into the anterior chamber. The anterior chamber was filled with Viscoat viscoelastic.  A 2.4 millimeter keratome was used to make a near-clear corneal incision at the 9:00 position.  A curvilinear capsulorrhexis was made with a cystotome and capsulorrhexis forceps.  Balanced salt solution was used to hydrodissect and hydrodelineate the nucleus.   Phacoemulsification was then used in stop and chop fashion to remove the lens nucleus and epinucleus.  The remaining cortex was then removed using the irrigation and aspiration handpiece. Provisc was then placed into the capsular bag to distend it for lens placement.  A lens was then injected into the capsular bag.  The remaining  viscoelastic was aspirated.   Wounds were hydrated with balanced salt solution.  The anterior chamber was inflated to a physiologic pressure with balanced salt solution.  No wound leaks were noted. Cefuroxime 0.1 ml of a 10mg /ml solution was injected into the anterior chamber for a dose of 1 mg of intracameral antibiotic at the completion of the case.   Timolol and Brimonidine drops were applied to the eye.  The patient was taken to the recovery room in stable condition without complications of anesthesia or surgery.   Ryanna Teschner 12/25/2019, 12:14 PM

## 2020-01-21 ENCOUNTER — Encounter: Payer: Self-pay | Admitting: Ophthalmology

## 2020-01-27 ENCOUNTER — Other Ambulatory Visit
Admission: RE | Admit: 2020-01-27 | Discharge: 2020-01-27 | Disposition: A | Discharge status: Home / Self Care | Payer: Medicare Other | Source: Ambulatory Visit | Attending: Ophthalmology | Admitting: Ophthalmology

## 2020-01-27 ENCOUNTER — Other Ambulatory Visit: Payer: Self-pay

## 2020-01-27 DIAGNOSIS — Z20822 Contact with and (suspected) exposure to covid-19: Secondary | ICD-10-CM | POA: Insufficient documentation

## 2020-01-27 DIAGNOSIS — Z01812 Encounter for preprocedural laboratory examination: Secondary | ICD-10-CM | POA: Insufficient documentation

## 2020-01-27 LAB — SARS CORONAVIRUS 2 (TAT 6-24 HRS): SARS Coronavirus 2: NEGATIVE

## 2020-01-28 NOTE — Discharge Instructions (Signed)

## 2020-01-29 ENCOUNTER — Ambulatory Visit: Payer: Medicare Other | Admitting: Anesthesiology

## 2020-01-29 ENCOUNTER — Encounter
Admission: RE | Disposition: A | Discharge status: Home / Self Care | Payer: Self-pay | Source: Home / Self Care | Attending: Ophthalmology

## 2020-01-29 ENCOUNTER — Other Ambulatory Visit: Payer: Self-pay

## 2020-01-29 ENCOUNTER — Ambulatory Visit
Admission: RE | Admit: 2020-01-29 | Discharge: 2020-01-29 | Disposition: A | Discharge status: Home / Self Care | Payer: Medicare Other | Attending: Ophthalmology | Admitting: Ophthalmology

## 2020-01-29 ENCOUNTER — Encounter: Payer: Self-pay | Admitting: Ophthalmology

## 2020-01-29 DIAGNOSIS — H2512 Age-related nuclear cataract, left eye: Secondary | ICD-10-CM | POA: Insufficient documentation

## 2020-01-29 HISTORY — PX: ANTERIOR VITRECTOMY: SHX1173

## 2020-01-29 HISTORY — PX: CATARACT EXTRACTION W/PHACO: SHX586

## 2020-01-29 SURGERY — PHACOEMULSIFICATION, CATARACT, WITH IOL INSERTION
Anesthesia: Monitor Anesthesia Care | Site: Eye | Laterality: Left

## 2020-01-29 MED ORDER — NA HYALUR & NA CHOND-NA HYALUR 0.4-0.35 ML IO KIT
PACK | INTRAOCULAR | Status: DC | PRN
  Administered 2020-01-29: 1 mL via INTRAOCULAR

## 2020-01-29 MED ORDER — EPINEPHRINE PF 1 MG/ML IJ SOLN
INTRAOCULAR | Status: DC | PRN
  Administered 2020-01-29: 71 mL via OPHTHALMIC

## 2020-01-29 MED ORDER — BRIMONIDINE TARTRATE-TIMOLOL 0.2-0.5 % OP SOLN
OPHTHALMIC | Status: DC | PRN
  Administered 2020-01-29: 1 [drp] via OPHTHALMIC

## 2020-01-29 MED ORDER — ACETAMINOPHEN 160 MG/5ML PO SOLN: 325.0000 mg | ORAL | Status: DC | PRN

## 2020-01-29 MED ORDER — TETRACAINE HCL 0.5 % OP SOLN
1.0000 [drp] | OPHTHALMIC | Status: DC | PRN
  Administered 2020-01-29 (×3): 1 [drp] via OPHTHALMIC

## 2020-01-29 MED ORDER — MIDAZOLAM HCL 2 MG/2ML IJ SOLN
INTRAMUSCULAR | Status: DC | PRN
  Administered 2020-01-29 (×3): 1 mg via INTRAVENOUS

## 2020-01-29 MED ORDER — CEFUROXIME OPHTHALMIC INJECTION 1 MG/0.1 ML
INJECTION | OPHTHALMIC | Status: DC | PRN
  Administered 2020-01-29: 0.1 mL via INTRACAMERAL

## 2020-01-29 MED ORDER — NEOMYCIN-POLYMYXIN-DEXAMETH 3.5-10000-0.1 OP OINT
TOPICAL_OINTMENT | OPHTHALMIC | Status: DC | PRN
  Administered 2020-01-29: 1 via OPHTHALMIC

## 2020-01-29 MED ORDER — ARMC OPHTHALMIC DILATING DROPS
1.0000 "application " | OPHTHALMIC | Status: DC | PRN
  Administered 2020-01-29 (×3): 1 via OPHTHALMIC

## 2020-01-29 MED ORDER — ACETAMINOPHEN 325 MG PO TABS: 325.0000 mg | ORAL_TABLET | ORAL | Status: DC | PRN

## 2020-01-29 MED ORDER — LIDOCAINE HCL (PF) 2 % IJ SOLN
INTRAOCULAR | Status: DC | PRN
  Administered 2020-01-29: 2 mL

## 2020-01-29 MED ORDER — TRIAMCINOLONE ACETONIDE 40 MG/ML IO SUSP
INTRAOCULAR | Status: DC | PRN
  Administered 2020-01-29: 40 mg

## 2020-01-29 MED ORDER — LACTATED RINGERS IV SOLN: INTRAVENOUS | Status: DC

## 2020-01-29 MED ORDER — FENTANYL CITRATE (PF) 100 MCG/2ML IJ SOLN
INTRAMUSCULAR | Status: DC | PRN
  Administered 2020-01-29 (×2): 50 ug via INTRAVENOUS

## 2020-01-29 MED ORDER — MOXIFLOXACIN HCL 0.5 % OP SOLN
1.0000 [drp] | OPHTHALMIC | Status: DC | PRN
  Administered 2020-01-29 (×3): 1 [drp] via OPHTHALMIC

## 2020-01-29 SURGICAL SUPPLY — 25 items
CANNULA ANT/CHMB 27G (MISCELLANEOUS) ×1 IMPLANT
CANNULA ANT/CHMB 27GA (MISCELLANEOUS) ×2 IMPLANT
GLOVE SURG LX 7.5 STRW (GLOVE) ×1
GLOVE SURG LX STRL 7.5 STRW (GLOVE) ×1 IMPLANT
GLOVE SURG TRIUMPH 8.0 PF LTX (GLOVE) ×2 IMPLANT
GOWN STRL REUS W/ TWL LRG LVL3 (GOWN DISPOSABLE) ×2 IMPLANT
GOWN STRL REUS W/TWL LRG LVL3 (GOWN DISPOSABLE) ×4
LENS IOL DIOP 26.0 (Intraocular Lens) ×2 IMPLANT
LENS IOL TECNIS MONO 26.0 (Intraocular Lens) IMPLANT
MARKER SKIN DUAL TIP RULER LAB (MISCELLANEOUS) ×2 IMPLANT
NDL CAPSULORHEX 25GA (NEEDLE) ×1 IMPLANT
NDL FILTER BLUNT 18X1 1/2 (NEEDLE) ×2 IMPLANT
NEEDLE CAPSULORHEX 25GA (NEEDLE) ×2 IMPLANT
NEEDLE FILTER BLUNT 18X 1/2SAF (NEEDLE) ×2
NEEDLE FILTER BLUNT 18X1 1/2 (NEEDLE) ×2 IMPLANT
PACK CATARACT BRASINGTON (MISCELLANEOUS) ×2 IMPLANT
PACK EYE AFTER SURG (MISCELLANEOUS) ×2 IMPLANT
PACK OPTHALMIC (MISCELLANEOUS) ×2 IMPLANT
SOLUTION OPHTHALMIC SALT (MISCELLANEOUS) ×2 IMPLANT
SUT ETHILON 10-0 CS-B-6CS-B-6 (SUTURE) ×2
SUTURE EHLN 10-0 CS-B-6CS-B-6 (SUTURE) IMPLANT
SYR 3ML LL SCALE MARK (SYRINGE) ×4 IMPLANT
SYR TB 1ML LUER SLIP (SYRINGE) ×2 IMPLANT
WATER STERILE IRR 250ML POUR (IV SOLUTION) ×2 IMPLANT
WIPE NON LINTING 3.25X3.25 (MISCELLANEOUS) ×2 IMPLANT

## 2020-01-29 NOTE — Transfer of Care (Signed)
Immediate Anesthesia Transfer of Care Note  Patient: Sylvia Cole  Procedure(s) Performed: CATARACT EXTRACTION PHACO AND INTRAOCULAR LENS PLACEMENT (IOC) LEFT 5.44 00:56.4 9.6% (Left Eye) ANTERIOR VITRECTOMY (Left Eye)  Patient Location: PACU  Anesthesia Type: MAC  Level of Consciousness: awake, alert  and patient cooperative  Airway and Oxygen Therapy: Patient Spontanous Breathing and Patient connected to supplemental oxygen  Post-op Assessment: Post-op Vital signs reviewed, Patient's Cardiovascular Status Stable, Respiratory Function Stable, Patent Airway and No signs of Nausea or vomiting  Post-op Vital Signs: Reviewed and stable  Complications: No complications documented.

## 2020-01-29 NOTE — Op Note (Signed)
OPERATIVE NOTE  Sylvia Cole [id number] 01/29/2020   PREOPERATIVE DIAGNOSIS:  Nuclear sclerotic cataract left eye. H25.12   POSTOPERATIVE DIAGNOSIS:    Nuclear sclerotic cataract left eye.     PROCEDURE:  Phacoemusification with posterior chamber intraocular lens placement of the left eye  Ultrasound time: Procedure(s): CATARACT EXTRACTION PHACO AND INTRAOCULAR LENS PLACEMENT (IOC) LEFT 5.44 00:56.4 9.6% (Left) ANTERIOR VITRECTOMY (Left)  LENS:   Implant Name Type Inv. Item Serial No. Manufacturer Lot No. LRB No. Used Action  LENS IOL DIOP 26.0 - [id number] Intraocular Lens LENS IOL DIOP 26.0 [id number] JOHNSON   Left 1 Wasted  LENS IOL DIOP 26.0 - [id number] Intraocular Lens LENS IOL DIOP 26.0 [id number] JOHNSON   Left 1 Implanted  first lens stuck in incision.  Second lens inserted.    SURGEON:  Wyonia Hough, MD   ANESTHESIA:  Topical with tetracaine drops and 2% Xylocaine jelly, augmented with 1% preservative-free intracameral lidocaine.    COMPLICATIONS:  Posterior capsule tear requiring anterior vitrectomy   DESCRIPTION OF PROCEDURE:  The patient was identified in the holding room and transported to the operating room and placed in the supine position under the operating microscope.  The left eye was identified as the operative eye and it was prepped and draped in the usual sterile ophthalmic fashion.   A 1 millimeter clear-corneal paracentesis was made at the 1:30 position.  0.5 ml of preservative-free 1% lidocaine was injected into the anterior chamber.  The anterior chamber was filled with Viscoat viscoelastic.  A 2.4 millimeter keratome was used to make a near-clear corneal incision at the 10:30 position.  .  A curvilinear capsulorrhexis was made with a cystotome and capsulorrhexis forceps.  Balanced salt solution was used to hydrodissect and hydrodelineate the nucleus.   Phacoemulsification was then used in stop and chop fashion to remove the lens nucleus and  epinucleus.  The remaining cortex was then removed using the irrigation and aspiration handpiece. During aspiration, a tear was noted in the posterior capsule.  Additional viscoelastic was placed into the anterior chamber and the instruments were removed.   A new paracentesis was added at the 8:30 position. Triamcinolone was injected into the anterior chamber.   Bimanual anterior vitrectomy was performed to remove the vitreous and lens cortex.  The capsulorrhexis was noted to be round and there was no vitreous noted in the anterior chamber.  Wounds were checked with Weck cells and noted to be free of incarcerated vitreous strands. The small posterior capsule tear was from the 6:00 to 9:00 position and had not extended during vitrectomy.  Provisc was then placed into the capsular bag to distend it for lens placement.  A lens was then injected into the capsular bag with haptics within the intact portion of the capsule. The bimanual vitrector was used to aspirate the viscoelastic. Wounds were again checked to ensure no vitreous was present.  The lens was well centered.    Wounds were hydrated with balanced salt solution.  The anterior chamber was inflated to a physiologic pressure with balanced salt solution.  No wound leaks were noted. Cefuroxime 0.1 ml of a 10mg /ml solution was injected into the anterior chamber for a dose of 1 mg of intracameral antibiotic at the completion of the case.   Timolol and Brimonidine drops and Maxitrol ointment were applied to the eye.  The eye was shielded. The patient was taken to the recovery room in stable condition .  Sylvia Cole 01/29/2020, 10:25 AM

## 2020-01-29 NOTE — Anesthesia Procedure Notes (Signed)
Procedure Name: MAC Date/Time: 01/29/2020 9:47 AM Performed by: Cameron Ali, CRNA Pre-anesthesia Checklist: Patient identified, Emergency Drugs available, Suction available, Timeout performed and Patient being monitored Patient Re-evaluated:Patient Re-evaluated prior to induction Oxygen Delivery Method: Nasal cannula Placement Confirmation: positive ETCO2

## 2020-01-29 NOTE — Anesthesia Postprocedure Evaluation (Signed)
Anesthesia Post Note  Patient: Sylvia Cole  Procedure(s) Performed: CATARACT EXTRACTION PHACO AND INTRAOCULAR LENS PLACEMENT (IOC) LEFT 5.44 00:56.4 9.6% (Left Eye) ANTERIOR VITRECTOMY (Left Eye)     Patient location during evaluation: PACU Anesthesia Type: MAC Level of consciousness: awake and alert Pain management: pain level controlled Vital Signs Assessment: post-procedure vital signs reviewed and stable Respiratory status: spontaneous breathing, nonlabored ventilation, respiratory function stable and patient connected to nasal cannula oxygen Cardiovascular status: stable and blood pressure returned to baseline Postop Assessment: no apparent nausea or vomiting Anesthetic complications: no   No complications documented.  Wanda Plump Tashi Band

## 2020-01-29 NOTE — Anesthesia Preprocedure Evaluation (Signed)
Anesthesia Evaluation  Patient identified by MRN, date of birth, ID band  Airway Mallampati: II   Neck ROM: Limited    Dental   Pulmonary neg pulmonary ROS,    Pulmonary exam normal        Cardiovascular negative cardio ROS Normal cardiovascular exam     Neuro/Psych    GI/Hepatic   Endo/Other  negative endocrine ROS  Renal/GU      Musculoskeletal  (+) Arthritis ,   Abdominal   Peds  Hematology   Anesthesia Other Findings   Reproductive/Obstetrics                             Anesthesia Physical Anesthesia Plan  ASA: II  Anesthesia Plan: MAC   Post-op Pain Management:    Induction:   PONV Risk Score and Plan:   Airway Management Planned: Nasal Cannula and Natural Airway  Additional Equipment:   Intra-op Plan:   Post-operative Plan:   Informed Consent: I have reviewed the patients History and Physical, chart, labs and discussed the procedure including the risks, benefits and alternatives for the proposed anesthesia with the patient or authorized representative who has indicated his/her understanding and acceptance.       Plan Discussed with:   Anesthesia Plan Comments:         Anesthesia Quick Evaluation

## 2020-01-29 NOTE — H&P (Signed)

## 2020-01-30 ENCOUNTER — Encounter: Payer: Self-pay | Admitting: Ophthalmology

## 2020-05-15 ENCOUNTER — Emergency Department
Admission: EM | Admit: 2020-05-15 | Discharge: 2020-05-15 | Disposition: A | Discharge status: Home / Self Care | Payer: Medicare Other | Attending: Emergency Medicine | Admitting: Emergency Medicine

## 2020-05-15 ENCOUNTER — Other Ambulatory Visit: Payer: Self-pay

## 2020-05-15 ENCOUNTER — Encounter: Payer: Self-pay | Admitting: Emergency Medicine

## 2020-05-15 DIAGNOSIS — M5442 Lumbago with sciatica, left side: Secondary | ICD-10-CM | POA: Diagnosis not present

## 2020-05-15 DIAGNOSIS — G8929 Other chronic pain: Secondary | ICD-10-CM | POA: Diagnosis not present

## 2020-05-15 DIAGNOSIS — M545 Low back pain, unspecified: Secondary | ICD-10-CM | POA: Diagnosis present

## 2020-05-15 MED ORDER — KETOROLAC TROMETHAMINE 60 MG/2ML IM SOLN
30.0000 mg | Freq: Once | INTRAMUSCULAR | Status: AC
  Administered 2020-05-15: 30 mg via INTRAMUSCULAR
  Filled 2020-05-15: qty 2

## 2020-05-15 MED ORDER — HYDROMORPHONE HCL 1 MG/ML IJ SOLN
1.0000 mg | Freq: Once | INTRAMUSCULAR | Status: AC
  Administered 2020-05-15: 1 mg via INTRAMUSCULAR
  Filled 2020-05-15 (×2): qty 1

## 2020-05-15 MED ORDER — PREDNISONE 10 MG PO TABS: ORAL_TABLET | ORAL | 0 refills | Status: AC

## 2020-05-15 NOTE — ED Triage Notes (Signed)
Pt to ED via POV with c/o L lower back pain that radiates down her leg. Pt states hx of bulging disc, pt states pain has been ongoing x 2 weeks. Pt ambulatory with limp, placed in wheelchair on arrival to ED. Pt states is currently taking Flexeril and methadone for pain.

## 2020-05-16 NOTE — ED Provider Notes (Signed)
[facility] Emergency Department Provider Note  ____________________________________________   Event Date/Time   First MD Initiated Contact with Patient 05/15/20 1725     (approximate)  I have reviewed the triage vital signs and the nursing notes.   HISTORY  Chief Complaint Back Pain  HPI Sylvia Cole is a 65 y.o. female who presents to the emergency department for evaluation of low back pain.  Patient states that the pain radiates down the left lower leg.  She has history of bulging disc with multiple back surgeries.  States that she has been recommended for surgery before but did not want to have it at that time.  She currently takes Flexeril and methadone for her pain and has for a long time.  She denies any recent injury, denies recent fall to cause her worsening pain recently.  She denies fevers, denies loss of bowel or bladder control, denies motor weakness.  States that occasionally when her back "acts up",  She comes here for "a shot, sleeps it off and her back is better.".  She denies taking any anti-inflammatory because she was told that it would interfere with her Flexeril and methadone.         Past Medical History:  Diagnosis Date  . Anxiety   . Arthritis   . Chronic back pain   . Wears dentures    full lower    Patient Active Problem List   Diagnosis Date Noted  . Skin tag 05/12/2017  . Recurrent UTI 10/03/2016  . Vaginal atrophy 10/03/2016  . Skin lesion of breast 10/20/2015    Past Surgical History:  Procedure Laterality Date  . ABDOMINAL HYSTERECTOMY    . ANTERIOR VITRECTOMY Left 01/29/2020   Procedure: ANTERIOR VITRECTOMY;  Surgeon: Leandrew Koyanagi, MD;  Location: [place];  Service: Ophthalmology;  Laterality: Left;  . BACK SURGERY    . BREAST BIOPSY Bilateral yrs ago   benign  . BREAST EXCISIONAL BIOPSY Bilateral yrs ago   benign  . CATARACT EXTRACTION W/PHACO Right 12/25/2019   Procedure: CATARACT  EXTRACTION PHACO AND INTRAOCULAR LENS PLACEMENT (IOC) RIGHT 8.52 00:56.1 15.1%;  Surgeon: Leandrew Koyanagi, MD;  Location: [place];  Service: Ophthalmology;  Laterality: Right;  . CATARACT EXTRACTION W/PHACO Left 01/29/2020   Procedure: CATARACT EXTRACTION PHACO AND INTRAOCULAR LENS PLACEMENT (IOC) LEFT 5.44 00:56.4 9.6%;  Surgeon: Leandrew Koyanagi, MD;  Location: [place];  Service: Ophthalmology;  Laterality: Left;  . COLONOSCOPY WITH PROPOFOL N/A 04/14/2015   Procedure: COLONOSCOPY WITH PROPOFOL;  Surgeon: Robert Bellow, MD;  Location: [facility];  Service: Endoscopy;  Laterality: N/A;    Prior to Admission medications   Medication Sig Start Date End Date Taking? Authorizing Provider  predniSONE (DELTASONE) 10 MG tablet Take 6 tablets (60 mg total) by mouth daily for 1 day, THEN 5 tablets (50 mg total) daily for 1 day, THEN 4 tablets (40 mg total) daily for 1 day, THEN 3 tablets (30 mg total) daily for 1 day, THEN 2 tablets (20 mg total) daily for 1 day, THEN 1 tablet (10 mg total) daily for 1 day. 05/15/20 05/21/20 Yes Analeigha Nauman, Farrel Gordon, PA  cyclobenzaprine (FLEXERIL) 10 MG tablet Take 10 mg by mouth 3 (three) times daily as needed for muscle spasms.    [provider]  meclizine (ANTIVERT) 25 MG tablet Take 25 mg by mouth 3 (three) times daily as needed for dizziness. Patient not taking: Reported on 01/29/2020    [provider]  methadone (DOLOPHINE) 5 MG tablet Take 5 mg by mouth daily.     [provider]  metoprolol succinate (TOPROL-XL) 50 MG 24 hr tablet Take 50 mg by mouth daily. 09/10/19   [provider]  OSPHENA 60 MG TABS TAKE 1 TABLET BY MOUTH DAILY 10/15/19   Gae Dry, MD  traZODone (DESYREL) 50 MG tablet Take 50 mg by mouth at bedtime.    [provider]    Allergies Fluconazole, Bactrim [sulfamethoxazole-trimethoprim], Celebrex [celecoxib], and Ciprofloxacin  Family History  Problem  Relation Age of Onset  . Breast cancer Mother 27  . Breast cancer Paternal Aunt   . Breast cancer Paternal Aunt     Social History Social History   Tobacco Use  . Smoking status: Never Smoker  . Smokeless tobacco: Never Used  Vaping Use  . Vaping Use: Never used  Substance Use Topics  . Alcohol use: Yes    Alcohol/week: 1.0 standard drink    Types: 1 Standard drinks or equivalent per week  . Drug use: No    Review of Systems Constitutional: No fever/chills Eyes: No visual changes. ENT: No sore throat. Cardiovascular: Denies chest pain. Respiratory: Denies shortness of breath. Gastrointestinal: No abdominal pain.  No nausea, no vomiting.  No diarrhea.  No constipation. Genitourinary: Negative for dysuria. Musculoskeletal: + back pain, + left leg pain Skin: Negative for rash. Neurological: Negative for headaches, focal weakness or numbness.   ____________________________________________   PHYSICAL EXAM:  VITAL SIGNS: ED Triage Vitals  Enc Vitals Group     BP 05/15/20 1707 (!) 160/95     Pulse Rate 05/15/20 1707 93     Resp 05/15/20 1707 20     Temp 05/15/20 1707 98.7 F (37.1 C)     Temp Source 05/15/20 1707 Oral     SpO2 05/15/20 1707 96 %     Weight 05/15/20 1706 140 lb (63.5 kg)     Height 05/15/20 1706 4\' 10"  (1.473 m)     Head Circumference --      Peak Flow --      Pain Score 05/15/20 1705 10     Pain Loc --      Pain Edu? --      Excl. in [place]? --    Constitutional: Alert and oriented. Well appearing and in no acute distress. Eyes: Conjunctivae are normal. PERRL. EOMI. Head: Atraumatic. Nose: No congestion/rhinnorhea. Mouth/Throat: Mucous membranes are moist.  Neck: No stridor.   Cardiovascular: Normal rate, regular rhythm. Grossly normal heart sounds.  Good peripheral circulation. Respiratory: Normal respiratory effort.  No retractions. Lungs CTAB. Gastrointestinal: Soft and nontender. No distention. No abdominal bruits. No CVA  tenderness. Musculoskeletal: There is no tenderness to palpation of the midline of the thoracic or lumbar spine, no step-off deformity.  There is no tenderness to palpation of the right paraspinal musculature.  There is tenderness to palpation of the left paraspinal musculature, into the left SI joint and left gluteal region.  The patient also has tenderness to the left greater trochanter.  Negative logroll of the left hip.  Patient has 5/5 strength in the bilateral lower extremities in ankle plantarflexion, dorsiflexion, knee flexion extension, hip flexion.  Dorsal pedal pulses 2+ bilaterally. Neurologic:  Normal speech and language. No gross focal neurologic deficits are appreciated.  Skin:  Skin is warm, dry and intact. No rash noted. Psychiatric: Mood and affect are normal. Speech and behavior are normal.   ___________________________________________   INITIAL IMPRESSION / ASSESSMENT AND  PLAN / ED COURSE  As part of my medical decision making, I reviewed the following data within the [place] notes reviewed and incorporated and Notes from prior ED visits        Patient is a 65 year old female who presents to the emergency department for evaluation of acute on chronic low back pain.  See HPI for further details.  In triage, the patient is mildly hypertensive but otherwise has normal vital signs.  On physical exam, the patient does have tenderness to the left paraspinal musculature as well as into the left hip.  She denies recent fall or injury or trauma.  She maintains full 5/5 motor strength in the bilateral lower extremities and pulses are normal.  Given the absence of red flag symptoms and recent trauma, repeat imaging was not obtained.  Patient is already established with Dr. Elayne Guerin and has plans to follow-up with him for possible repeat surgical intervention.  She states that she comes here for a shot, which causes her to sleep it off and she typically  has relief of her symptoms.  The chart was reviewed, and it does appear that this happens, though it is not on a very frequent basis.  Will initiate treatment with 1 dose of IM pain medicine, 1 dose of IM Toradol and follow-up with a steroid taper.  Patient is amenable with this plan, she was strongly encouraged to make another appointment with Dr. Cari Caraway in regards to whether or not this needs different intervention.  Patient is stable this time for outpatient therapy.      ____________________________________________   FINAL CLINICAL IMPRESSION(S) / ED DIAGNOSES  Final diagnoses:  Chronic bilateral low back pain with left-sided sciatica     ED Discharge Orders         Ordered    predniSONE (DELTASONE) 10 MG tablet        05/15/20 1746          *Please note:  Sylvia Cole was evaluated in Emergency Department on 05/16/2020 for the symptoms described in the history of present illness. She was evaluated in the context of the global COVID-19 pandemic, which necessitated consideration that the patient might be at risk for infection with the SARS-CoV-2 virus that causes COVID-19. Institutional protocols and algorithms that pertain to the evaluation of patients at risk for COVID-19 are in a state of rapid change based on information released by regulatory bodies including the CDC and federal and state organizations. These policies and algorithms were followed during the patient's care in the ED.  Some ED evaluations and interventions may be delayed as a result of limited staffing during and the pandemic.*   Note:  This document was prepared using Dragon voice recognition software and may include unintentional dictation errors.   Marlana Salvage, PA 05/16/20 2346    Duffy Bruce, MD 05/18/20 2029

## 2020-06-22 ENCOUNTER — Other Ambulatory Visit: Payer: Self-pay

## 2020-06-22 ENCOUNTER — Ambulatory Visit
Admission: RE | Admit: 2020-06-22 | Discharge: 2020-06-22 | Disposition: A | Discharge status: Home / Self Care | Payer: Medicare Other | Source: Ambulatory Visit | Attending: Obstetrics & Gynecology | Admitting: Obstetrics & Gynecology

## 2020-06-22 DIAGNOSIS — Z1231 Encounter for screening mammogram for malignant neoplasm of breast: Secondary | ICD-10-CM | POA: Insufficient documentation

## 2020-06-23 ENCOUNTER — Encounter: Payer: Self-pay | Admitting: Obstetrics & Gynecology

## 2020-09-09 ENCOUNTER — Other Ambulatory Visit
Admission: RE | Admit: 2020-09-09 | Discharge: 2020-09-09 | Disposition: A | Discharge status: Home / Self Care | Payer: Medicare Other | Source: Ambulatory Visit | Attending: Family Medicine | Admitting: Family Medicine

## 2020-09-09 DIAGNOSIS — R600 Localized edema: Secondary | ICD-10-CM | POA: Insufficient documentation

## 2020-09-09 DIAGNOSIS — I10 Essential (primary) hypertension: Secondary | ICD-10-CM | POA: Diagnosis present

## 2020-09-09 DIAGNOSIS — R059 Cough, unspecified: Secondary | ICD-10-CM | POA: Diagnosis present

## 2020-09-09 LAB — TROPONIN I (HIGH SENSITIVITY): Troponin I (High Sensitivity): 3 ng/L (ref <18)

## 2020-09-09 LAB — BRAIN NATRIURETIC PEPTIDE: B Natriuretic Peptide: 49.4 pg/mL (ref 0.0–100.0)

## 2020-09-14 ENCOUNTER — Other Ambulatory Visit: Payer: Self-pay

## 2020-09-14 ENCOUNTER — Emergency Department
Admission: EM | Admit: 2020-09-14 | Discharge: 2020-09-14 | Disposition: A | Discharge status: Home / Self Care | Payer: Medicare Other | Attending: Emergency Medicine | Admitting: Emergency Medicine

## 2020-09-14 DIAGNOSIS — Z20822 Contact with and (suspected) exposure to covid-19: Secondary | ICD-10-CM | POA: Insufficient documentation

## 2020-09-14 DIAGNOSIS — M545 Low back pain, unspecified: Secondary | ICD-10-CM | POA: Insufficient documentation

## 2020-09-14 DIAGNOSIS — R509 Fever, unspecified: Secondary | ICD-10-CM | POA: Diagnosis present

## 2020-09-14 DIAGNOSIS — Z79899 Other long term (current) drug therapy: Secondary | ICD-10-CM | POA: Insufficient documentation

## 2020-09-14 DIAGNOSIS — B349 Viral infection, unspecified: Secondary | ICD-10-CM | POA: Insufficient documentation

## 2020-09-14 LAB — RESP PANEL BY RT-PCR (FLU A&B, COVID) ARPGX2
Influenza A by PCR: NEGATIVE
Influenza B by PCR: NEGATIVE
SARS Coronavirus 2 by RT PCR: NEGATIVE

## 2020-09-14 MED ORDER — KETOROLAC TROMETHAMINE 60 MG/2ML IM SOLN
30.0000 mg | Freq: Once | INTRAMUSCULAR | Status: AC
  Administered 2020-09-14: 30 mg via INTRAMUSCULAR
  Filled 2020-09-14: qty 2

## 2020-09-14 MED ORDER — KETOROLAC TROMETHAMINE 10 MG PO TABS: 10.0000 mg | ORAL_TABLET | Freq: Four times a day (QID) | ORAL | 0 refills | Status: AC | PRN

## 2020-09-14 NOTE — ED Provider Notes (Signed)
[facility] Emergency Department Provider Note ____________________________________________   Event Date/Time   First MD Initiated Contact with Patient 09/14/20 1130     (approximate)  I have reviewed the triage vital signs and the nursing notes.   HISTORY  Chief Complaint Headache (Fever/) and Fever  HPI Sylvia Cole is a 65 y.o. female with history of chronic back pain presents to the emergency department for treatment and evaluation of sore throat, headache, nasal congestion, and occasional cough with increase in back pain/body aches that started yesterday.  She has taken her prescribed methadone and Flexeril without relief of headache or back pain.  She reports that she has been advised to avoid taking NSAIDs or additional Tylenol due to her daily methadone use so she has not added anything to help with current symptoms.  No known exposure to COVID-19.         Past Medical History:  Diagnosis Date   Anxiety    Arthritis    Chronic back pain    Wears dentures    full lower    Patient Active Problem List   Diagnosis Date Noted   Skin tag 05/12/2017   Recurrent UTI 10/03/2016   Vaginal atrophy 10/03/2016   Skin lesion of breast 10/20/2015    Past Surgical History:  Procedure Laterality Date   ABDOMINAL HYSTERECTOMY     ANTERIOR VITRECTOMY Left 01/29/2020   Procedure: ANTERIOR VITRECTOMY;  Surgeon: Leandrew Koyanagi, MD;  Location: [place];  Service: Ophthalmology;  Laterality: Left;   BACK SURGERY     BREAST BIOPSY Bilateral yrs ago   benign   CATARACT EXTRACTION W/PHACO Right 12/25/2019   Procedure: CATARACT EXTRACTION PHACO AND INTRAOCULAR LENS PLACEMENT (IOC) RIGHT 8.52 00:56.1 15.1%;  Surgeon: Leandrew Koyanagi, MD;  Location: [place];  Service: Ophthalmology;  Laterality: Right;   CATARACT EXTRACTION W/PHACO Left 01/29/2020   Procedure: CATARACT EXTRACTION PHACO AND INTRAOCULAR LENS PLACEMENT (IOC) LEFT  5.44 00:56.4 9.6%;  Surgeon: Leandrew Koyanagi, MD;  Location: [place];  Service: Ophthalmology;  Laterality: Left;   COLONOSCOPY WITH PROPOFOL N/A 04/14/2015   Procedure: COLONOSCOPY WITH PROPOFOL;  Surgeon: Robert Bellow, MD;  Location: [facility] ENDOSCOPY;  Service: Endoscopy;  Laterality: N/A;    Prior to Admission medications   Medication Sig Start Date End Date Taking? Authorizing Provider  ketorolac (TORADOL) 10 MG tablet Take 1 tablet (10 mg total) by mouth every 6 (six) hours as needed. 09/14/20  Yes Janaria Mccammon B, FNP  cyclobenzaprine (FLEXERIL) 10 MG tablet Take 10 mg by mouth 3 (three) times daily as needed for muscle spasms.    [provider]  meclizine (ANTIVERT) 25 MG tablet Take 25 mg by mouth 3 (three) times daily as needed for dizziness. Patient not taking: Reported on 01/29/2020    [provider]  methadone (DOLOPHINE) 5 MG tablet Take 5 mg by mouth daily.     [provider]  metoprolol succinate (TOPROL-XL) 50 MG 24 hr tablet Take 50 mg by mouth daily. 09/10/19   [provider]  OSPHENA 60 MG TABS TAKE 1 TABLET BY MOUTH DAILY 10/15/19   Gae Dry, MD  traZODone (DESYREL) 50 MG tablet Take 50 mg by mouth at bedtime.    [provider]    Allergies Fluconazole, Bactrim [sulfamethoxazole-trimethoprim], Celebrex [celecoxib], and Ciprofloxacin  Family History  Problem Relation Age of Onset   Breast cancer Mother 67   Breast cancer Paternal Aunt    Breast cancer  Paternal Aunt     Social History Social History   Tobacco Use   Smoking status: Never   Smokeless tobacco: Never  Vaping Use   Vaping Use: Never used  Substance Use Topics   Alcohol use: Yes    Alcohol/week: 1.0 standard drink    Types: 1 Standard drinks or equivalent per week   Drug use: No    Review of Systems  Constitutional: Positive for chills Eyes: No visual changes. ENT: No sore throat. Cardiovascular: Denies chest  pain. Respiratory: Denies shortness of breath. Gastrointestinal: No abdominal pain.  No nausea, no vomiting.  No diarrhea.  No constipation. Genitourinary: Negative for dysuria. Musculoskeletal: Positive for acute on chronic back pain and diffuse body aches  skin: Negative for rash. Neurological: Positive for headaches, f negative for ocal weakness or numbness.  ____________________________________________   PHYSICAL EXAM:  VITAL SIGNS: ED Triage Vitals  Enc Vitals Group     BP 09/14/20 1049 135/90     Pulse Rate 09/14/20 1049 83     Resp 09/14/20 1049 18     Temp 09/14/20 1049 98.5 F (36.9 C)     Temp Source 09/14/20 1049 Oral     SpO2 09/14/20 1049 97 %     Weight 09/14/20 1046 150 lb (68 kg)     Height 09/14/20 1046 4\' 10"  (1.473 m)     Head Circumference --      Peak Flow --      Pain Score 09/14/20 1046 10     Pain Loc --      Pain Edu? --      Excl. in [place]? --     Constitutional: Alert and oriented. Well appearing and in no acute distress. Eyes: Conjunctivae are normal. PERRL. EOMI. Head: Atraumatic. Nose: No congestion/rhinnorhea. Mouth/Throat: Mucous membranes are moist.  Oropharynx non-erythematous. Neck: No stridor.   Hematological/Lymphatic/Immunilogical: No cervical lymphadenopathy. Cardiovascular: Normal rate, regular rhythm. Grossly normal heart sounds.  Good peripheral circulation. Respiratory: Normal respiratory effort.  No retractions. Lungs CTAB. Gastrointestinal: Soft and nontender. No distention. No abdominal bruits. No CVA tenderness. Genitourinary:  Musculoskeletal: No lower extremity tenderness nor edema.  No joint effusions. Neurologic:  Normal speech and language. No gross focal neurologic deficits are appreciated. No gait instability. Skin:  Skin is warm, dry and intact. No rash noted. Psychiatric: Mood and affect are normal. Speech and behavior are normal.  ____________________________________________   LABS (all labs ordered are listed,  but only abnormal results are displayed)  Labs Reviewed  RESP PANEL BY RT-PCR (FLU A&B, COVID) ARPGX2   ____________________________________________  EKG  Not indicated ____________________________________________  RADIOLOGY  ED MD interpretation:    Not indicated I, Sherrie George, personally viewed and evaluated these images (plain radiographs) as part of my medical decision making, as well as reviewing the written report by the radiologist.  Official radiology report(s): No results found.  ____________________________________________   PROCEDURES  Procedure(s) performed (including Critical Care):  Procedures  ____________________________________________   INITIAL IMPRESSION / ASSESSMENT AND PLAN     65 year old female presenting to the emergency department for treatment and evaluation of symptoms as described in the HPI.  Plan will be to test for COVID-19 and influenza.  DIFFERENTIAL DIAGNOSIS  COVID-19, influenza, viral syndrome  ED COURSE  COVID and influenza testing negative. Will give Toradol for headache. She states this has helped in the past. She is to follow up with primary care or return to the ER for symptoms of concern.    ___________________________________________  FINAL CLINICAL IMPRESSION(S) / ED DIAGNOSES  Final diagnoses:  Viral illness     ED Discharge Orders          Ordered    ketorolac (TORADOL) 10 MG tablet  Every 6 hours PRN       Note to Pharmacy: Injection given in ER   09/14/20 1353             Sylvia Cole was evaluated in Emergency Department on 09/14/2020 for the symptoms described in the history of present illness. She was evaluated in the context of the global COVID-19 pandemic, which necessitated consideration that the patient might be at risk for infection with the SARS-CoV-2 virus that causes COVID-19. Institutional protocols and algorithms that pertain to the evaluation of patients at risk for COVID-19 are  in a state of rapid change based on information released by regulatory bodies including the CDC and federal and state organizations. These policies and algorithms were followed during the patient's care in the ED.   Note:  This document was prepared using Dragon voice recognition software and may include unintentional dictation errors.    Victorino Dike, FNP 09/14/20 1357    Lavonia Drafts, MD 09/14/20 [phone]

## 2020-09-14 NOTE — ED Triage Notes (Signed)
C/O headache and fever since last night.  AAOx3.  Skin warm and dry. NAD

## 2020-09-14 NOTE — ED Notes (Signed)
Patient presents with headache and fever along with left hip/back/leg pain that is chronic. She has taken no meds for fever  at home today for the above took bp,hormone and methadone this am

## 2020-09-14 NOTE — ED Triage Notes (Signed)
Pt c/o HA with fever chills and bodyaches since last night

## 2020-10-22 ENCOUNTER — Ambulatory Visit (INDEPENDENT_AMBULATORY_CARE_PROVIDER_SITE_OTHER): Payer: Medicare Other | Admitting: Obstetrics & Gynecology

## 2020-10-22 ENCOUNTER — Other Ambulatory Visit (HOSPITAL_COMMUNITY)
Admission: RE | Admit: 2020-10-22 | Discharge: 2020-10-22 | Disposition: A | Discharge status: Home / Self Care | Payer: Medicare Other | Source: Ambulatory Visit | Attending: Obstetrics & Gynecology | Admitting: Obstetrics & Gynecology

## 2020-10-22 ENCOUNTER — Other Ambulatory Visit: Payer: Self-pay

## 2020-10-22 ENCOUNTER — Encounter: Payer: Self-pay | Admitting: Obstetrics & Gynecology

## 2020-10-22 VITALS — BP 120/80 | Ht <= 58 in | Wt 145.0 lb

## 2020-10-22 DIAGNOSIS — Z1151 Encounter for screening for human papillomavirus (HPV): Secondary | ICD-10-CM | POA: Insufficient documentation

## 2020-10-22 DIAGNOSIS — Z803 Family history of malignant neoplasm of breast: Secondary | ICD-10-CM

## 2020-10-22 DIAGNOSIS — Z1272 Encounter for screening for malignant neoplasm of vagina: Secondary | ICD-10-CM

## 2020-10-22 DIAGNOSIS — Z01419 Encounter for gynecological examination (general) (routine) without abnormal findings: Secondary | ICD-10-CM | POA: Diagnosis not present

## 2020-10-22 DIAGNOSIS — N952 Postmenopausal atrophic vaginitis: Secondary | ICD-10-CM

## 2020-10-22 DIAGNOSIS — Z1231 Encounter for screening mammogram for malignant neoplasm of breast: Secondary | ICD-10-CM

## 2020-10-22 MED ORDER — OSPHENA 60 MG PO TABS: 1.0000 | ORAL_TABLET | Freq: Every day | ORAL | 3 refills | Status: AC

## 2020-10-22 NOTE — Progress Notes (Signed)
HPI:      [name] is a 65 y.o. G2P0011 who LMP was in the past, she presents today for her annual examination.  The patient has no complaints today. The patient is sexually active. Herlast pap: approximate date 2017 (Vag PAP) and was normal and last mammogram: approximate date 06/2020 and was normal.  The patient does perform self breast exams.  There is notable family history of breast cancer (mother, paternal aunts) in her family. The patient is not taking hormone replacement therapy. Patient denies post-menopausal vaginal bleeding.   The patient has regular exercise: yes. The patient denies current symptoms of depression.    GYN Hx: Last Colonoscopy:5 years ago. Normal.   PMHx: Past Medical History:  Diagnosis Date   Anxiety    Arthritis    Chronic back pain    Wears dentures    full lower   Past Surgical History:  Procedure Laterality Date   ABDOMINAL HYSTERECTOMY     ANTERIOR VITRECTOMY Left 01/29/2020   Procedure: ANTERIOR VITRECTOMY;  Surgeon: Leandrew Koyanagi, MD;  Location: [place];  Service: Ophthalmology;  Laterality: Left;   BACK SURGERY     BREAST BIOPSY Bilateral yrs ago   benign   CATARACT EXTRACTION W/PHACO Right 12/25/2019   Procedure: CATARACT EXTRACTION PHACO AND INTRAOCULAR LENS PLACEMENT (IOC) RIGHT 8.52 00:56.1 15.1%;  Surgeon: Leandrew Koyanagi, MD;  Location: [place];  Service: Ophthalmology;  Laterality: Right;   CATARACT EXTRACTION W/PHACO Left 01/29/2020   Procedure: CATARACT EXTRACTION PHACO AND INTRAOCULAR LENS PLACEMENT (IOC) LEFT 5.44 00:56.4 9.6%;  Surgeon: Leandrew Koyanagi, MD;  Location: [place];  Service: Ophthalmology;  Laterality: Left;   COLONOSCOPY WITH PROPOFOL N/A 04/14/2015   Procedure: COLONOSCOPY WITH PROPOFOL;  Surgeon: Anaisabel Pederson Bellow, MD;  Location: [facility] ENDOSCOPY;  Service: Endoscopy;  Laterality: N/A;   foot     Family History  Problem Relation Age of Onset   Breast cancer  Mother 2   Breast cancer Paternal Aunt    Breast cancer Paternal Aunt    Social History   Tobacco Use   Smoking status: Never   Smokeless tobacco: Never  Vaping Use   Vaping Use: Never used  Substance Use Topics   Alcohol use: Yes    Alcohol/week: 1.0 standard drink    Types: 1 Standard drinks or equivalent per week   Drug use: No    Current Outpatient Medications:    cyclobenzaprine (FLEXERIL) 10 MG tablet, Take 10 mg by mouth 3 (three) times daily as needed for muscle spasms., Disp: , Rfl:    ketorolac (TORADOL) 10 MG tablet, Take 1 tablet (10 mg total) by mouth every 6 (six) hours as needed., Disp: 20 tablet, Rfl: 0   meclizine (ANTIVERT) 25 MG tablet, Take 25 mg by mouth 3 (three) times daily as needed for dizziness., Disp: , Rfl:    methadone (DOLOPHINE) 5 MG tablet, Take 5 mg by mouth daily. , Disp: , Rfl:    metoprolol succinate (TOPROL-XL) 50 MG 24 hr tablet, Take 50 mg by mouth daily., Disp: , Rfl:    traZODone (DESYREL) 50 MG tablet, Take 50 mg by mouth at bedtime., Disp: , Rfl:    gabapentin (NEURONTIN) 100 MG capsule, Take by mouth., Disp: , Rfl:    Ospemifene (OSPHENA) 60 MG TABS, Take 1 tablet by mouth daily., Disp: 90 tablet, Rfl: 3 Allergies: Fluconazole, Bactrim [sulfamethoxazole-trimethoprim], Celebrex [celecoxib], and Ciprofloxacin  Review of Systems  Constitutional:  Positive for malaise/fatigue. Negative for  chills and fever.  HENT:  Negative for congestion, sinus pain and sore throat.   Eyes:  Negative for blurred vision and pain.  Respiratory:  Negative for cough and wheezing.   Cardiovascular:  Negative for chest pain and leg swelling.  Gastrointestinal:  Negative for abdominal pain, constipation, diarrhea, heartburn, nausea and vomiting.  Genitourinary:  Negative for dysuria, frequency, hematuria and urgency.  Musculoskeletal:  Positive for joint pain. Negative for back pain, myalgias and neck pain.  Skin:  Negative for itching and rash.  Neurological:   Negative for dizziness, tremors and weakness.  Endo/Heme/Allergies:  Does not bruise/bleed easily.  Psychiatric/Behavioral:  Negative for depression. The patient is not nervous/anxious and does not have insomnia.    Objective: BP 120/80   Ht _0  (1.473 m)   Wt 145 lb (65.8 kg)   BMI 30.31 kg/m   Filed Weights   10/22/20 1404  Weight: 145 lb (65.8 kg)   Body mass index is 30.31 kg/m. Physical Exam Constitutional:      General: She is not in acute distress.    Appearance: She is well-developed.  Genitourinary:     Vulva, bladder, rectum and urethral meatus normal.     No lesions in the vagina.     Genitourinary Comments: Vaginal cuff well healed     Right Labia: No rash, tenderness or lesions.    Left Labia: No tenderness, lesions or rash.    No vaginal bleeding.      Right Adnexa: not tender and no mass present.    Left Adnexa: not tender and no mass present.    Cervix is absent.     Uterus is absent.     Pelvic exam was performed with patient in the lithotomy position.  Breasts:    Right: No mass, skin change or tenderness.     Left: No mass, skin change or tenderness.  HENT:     Head: Normocephalic and atraumatic. No laceration.     Right Ear: Hearing normal.     Left Ear: Hearing normal.     Mouth/Throat:     Pharynx: Uvula midline.  Eyes:     Pupils: Pupils are equal, round, and reactive to light.  Neck:     Thyroid: No thyromegaly.  Cardiovascular:     Rate and Rhythm: Normal rate and regular rhythm.     Heart sounds: No murmur heard.   No friction rub. No gallop.  Pulmonary:     Effort: Pulmonary effort is normal. No respiratory distress.     Breath sounds: Normal breath sounds. No wheezing.  Abdominal:     General: Bowel sounds are normal. There is no distension.     Palpations: Abdomen is soft.     Tenderness: There is no abdominal tenderness. There is no rebound.  Musculoskeletal:        General: Normal range of motion.     Cervical back: Normal  range of motion and neck supple.  Neurological:     Mental Status: She is alert and oriented to person, place, and time.     Cranial Nerves: No cranial nerve deficit.  Skin:    General: Skin is warm and dry.  Psychiatric:        Judgment: Judgment normal.  Vitals reviewed.    Assessment: Annual Exam 1. Women's annual routine gynecological examination   2. Vaginal atrophy   3. Encounter for screening mammogram for malignant neoplasm of breast   4. Screening for vaginal cancer  5. Family history of breast cancer     Plan:            1.  Vaginal Screening-  Pap smear done today  2. Breast screening- Exam annually and mammogram scheduled  3. Colonoscopy every 10 years, Hemoccult testing after age 63  4. Labs managed by PCP  5. Counseling for hormonal therapy: none, no change in therapy today Will cont w Osphena for vag dryness and dyspareunia              6. FRAX - FRAX score for assessing the 10 year probability for fracture calculated and discussed today.  Based on age and score today, DEXA is not currently scheduled.   7.  She presents with a significant personal and/or family history of breast cancer in mother (age 75 dx) and paternal aunts (ages 56, 63). Details of which can be found in her medical/family history. She does not have a previously identified BRCA and Lynch syndrome mutation in her family. Due to her personal and/or family history of cancer she is a candidate for the [facility] test(s).    Risk for cancer, genetic susceptibility discussed.  Patient has requested gene testing.  Discussed BRCA as well as Lynch syndrome and other cancer risk assessments available based on her family history and personal history. Pros and cons of testing discussed.      F/U  Return in about 1 year (around 10/22/2021) for Annual.  Barnett Applebaum, MD, [facility], [facility] 10/22/2020  2:42 PM

## 2020-10-22 NOTE — Patient Instructions (Signed)
PAP every 5 years Mammogram every year    Call [phone] to schedule at [facility] Colonoscopy every 10 years Labs yearly (with PCP)  Thank you for choosing [facility]. As part of our ongoing efforts to improve patient experience, we would appreciate your feedback. Please fill out the short survey that you will receive by mail or MyChart. Your opinion is important to [place]! - Dr. Baxter Flattery Panel Testing for Cancer  What is cancer? Normal cells in the body grow, divide, and are replaced on a routine basis. Sometimes, cells divide abnormally and begin to grow out of control. These cells may form growths or tumors. Tumors can be benign (not cancer) or malignant (cancer). Benign tumors do not spread to other body tissues. Cancer tumors can invade and destroy nearby healthy tissues, bones, and organs. Cancer cells also can spread to other parts of the body and form new cancerous areas.  What causes cancer? Cancer is caused by many different factors. A few types of cancer are caused by changes in genes that can be passed from parent to child. Changes in genes are called mutations. Certain gene mutations are associated with family cancer syndromes. What are family cancer syndromes?  Family cancer syndromes are genetic conditions that increase the risk of certain types of cancer. They also are called hereditary or inherited cancer syndromes. Common family cancer syndromes include hereditary breast and ovarian cancer (HBOC) syndrome, Lynch syndrome, Li-Fraumeni syndrome, Cowden syndrome, and Peutz-Jeghers syndrome. What is genetic testing for cancer? Genetic testing for cancer looks for mutations in certain genes that are known to be linked to cancer. The results can help determine your risk of developing a disease like cancer or passing on a genetic disorder. What is multigene panel testing? Multigene panel testing is a type of genetic testing that looks for mutations in several genes at once.  This is different from single-gene testing, which looks for a mutation in a specific gene. Single-gene testing is often used when there is already a known gene mutation in a family. For example, testing for BRCA mutations only looks for changes in BRCA1 and BRCA2 genes. Who should have genetic testing? You may consider genetic testing if your personal or family history shows that you have an increased risk of cancer. Your obstetrician-gynecologist (ob-gyn) or other health care professional may ask you these and other questions: Have you or any family members been diagnosed with cancer?  If yes, which family members were diagnosed, with what types of cancer, and at what ages?  Were you or any of your family members born with birth defects?  Are you of [place] or [place] Jewish ancestry? Depending on your answers, your ob-gyn or other health care professional may suggest that you talk about genetic testing with a genetic counselor or a physician who is an expert in genetics. You can choose to have genetic testing, or you can choose not to. Before you decide, you should have genetic counseling. What is genetic counseling? In genetic counseling, you will talk with a genetic counselor or physician expert about the following: Your risk of getting a hereditary type of cancer  Who in your family could potentially get tested  How testing is done  What the test results may mean  What you may do depending on the results How is genetic testing done? Genetic testing typically is done from a blood sample or saliva sample. When is multigene panel testing recommended? Multigene panel testing may be useful if you are  at risk of a family cancer syndrome that has more than one gene associated with it  have a personal or family history of cancer and single-gene testing has not found a mutation, or the result is uncertain What are the benefits of multigene panel testing? Multigene panel testing looks at  multiple genes with one test. If a gene mutation is found, multigene panel testing may give you a better understanding of your cancer risk than single-gene testing  help your health care team decide what cancer screenings you might need beyond routine screenings  help you think about what you can do to prevent cancer What are the risks of multigene panel testing? The risks of multigene panel testing may include the following: Results can be complicated to interpret.  Testing may find gene mutations that show a moderate or uncertain risk of cancer.  It may be hard to know what you should do with your test results. You should talk with a genetic counselor or physician expert before and after genetic testing to learn what the results mean. If I have a gene mutation, should I tell my family? Having a gene mutation means you can pass the mutation to your children. Your siblings also may have the gene mutation. Although you do not have to tell your family members, sharing the information could be life-saving for them. With this information, your family members can decide whether to be tested and get cancer screenings at an early age. How can I prevent cancer if I test positive for a gene mutation? If you test positive for a gene mutation, you can discuss cancer screening and prevention options with your ob-gyn, genetic counselor, or other health care professional. It may be helpful to have earlier or more frequent cancer screening tests, which can find cancer at an early and more curable stage. Risk reduction steps like medication, surgery, and lifestyle changes also may be recommended. I'm concerned about discrimination based on genetic testing results. What should I know? Many people are concerned about possible employment discrimination or denial of insurance coverage based on genetic testing results. The Genetic Information Nondiscrimination Act of 2008 (GINA) makes it illegal for health insurers to  require genetic testing results or use results to make decisions about coverage, rates, or preexisting conditions. GINA also makes it illegal for employers to discriminate against employees or applicants because of genetic information. GINA does not apply to life insurance, long-term care insurance, or disability insurance. What should I know about direct-to-consumer genetic tests? Direct-to-consumer (or at-home) genetic tests are sold over the internet. You do not need a doctor's order to get one. The [employer] discourages use of direct-to-consumer genetic tests because the results may be misleading. For example, one commercial test for BRCA mutations only looks for three mutations, even though there are more than 500 BRCA mutations linked to cancer. The test results could cause unnecessary fear, or a false sense that you are not at risk. You should see a health care professional if you want a genetic test.  Glossary BRCA1 and BRCA2: Genes that keep cells from growing too rapidly. Changes in these genes have been linked to an increased risk of breast cancer and ovarian cancer.  Cowden Syndrome: A genetic condition that increases a person's risk of cancer of the breast, thyroid, uterus, colon, kidney, and skin. Genes: Segments of DNA that contain instructions for the development of a person's physical traits and control of the processes in the body. The gene  is the basic unit of heredity and can be passed from parent to child. Genetic Counselor: A health care professional with special training in genetics who can provide expert advice about genetic disorders and prenatal testing. Hereditary Breast and Ovarian Cancer (HBOC) Syndrome: A genetic condition that increases a person's risk of cancer of the breast, ovary, prostate, pancreas, and skin (melanoma). Li-Fraumeni Syndrome: A genetic condition that increases a person's risk of cancer of the breast, bones, soft  tissue, brain, and outer layer of the adrenal glands. Lynch Syndrome: A genetic condition that increases a person's risk of cancer of the colon, rectum, ovary, uterus, pancreas, and bile duct. Multigene Panel Testing: A type of genetic test that can look for mutations in multiple genes at once. Mutations: Changes in genes that can be passed from parent to child. Obstetrician-Gynecologist (Ob-Gyn): A doctor with special training and education in women's health. Peutz-Jeghers Syndrome: A genetic condition that increases a person's risk of cancer of the stomach, intestines, pancreas, cervix, ovary, and breast.

## 2020-10-26 LAB — CYTOLOGY - PAP
Comment: NEGATIVE
Diagnosis: NEGATIVE
High risk HPV: NEGATIVE

## 2020-12-15 ENCOUNTER — Other Ambulatory Visit: Payer: Self-pay | Admitting: Obstetrics and Gynecology

## 2020-12-15 ENCOUNTER — Telehealth: Payer: Self-pay

## 2020-12-15 DIAGNOSIS — Z803 Family history of malignant neoplasm of breast: Secondary | ICD-10-CM

## 2020-12-15 NOTE — Progress Notes (Signed)
This encounter was created in error - please disregard.

## 2020-12-15 NOTE — Progress Notes (Signed)
[facility] order from 10/22/20 testing.

## 2020-12-15 NOTE — Addendum Note (Signed)
Addended by: Ardeth Perfect B on: [id number] 10:27 AM   Modules accepted: Orders, Level of Service, SmartSet

## 2020-12-15 NOTE — Telephone Encounter (Signed)
Called [facility] to check on pts test. Shows cancelled on website. Rep says insurance does not cover test. They did reach out to pt to see if she could pay $249 to run test and she declined. Test cancelled.

## 2021-04-10 ENCOUNTER — Encounter: Payer: Self-pay | Admitting: Pharmacy Technician

## 2021-04-10 ENCOUNTER — Other Ambulatory Visit: Payer: Self-pay

## 2021-04-10 ENCOUNTER — Emergency Department
Admission: EM | Admit: 2021-04-10 | Discharge: 2021-04-10 | Disposition: A | Discharge status: Home / Self Care | Payer: Medicare Other | Attending: Emergency Medicine | Admitting: Emergency Medicine

## 2021-04-10 DIAGNOSIS — M5442 Lumbago with sciatica, left side: Secondary | ICD-10-CM | POA: Diagnosis not present

## 2021-04-10 DIAGNOSIS — M5432 Sciatica, left side: Secondary | ICD-10-CM

## 2021-04-10 DIAGNOSIS — M545 Low back pain, unspecified: Secondary | ICD-10-CM | POA: Diagnosis present

## 2021-04-10 MED ORDER — PREDNISONE 10 MG (21) PO TBPK: ORAL_TABLET | ORAL | 0 refills | Status: DC

## 2021-04-10 MED ORDER — HYDROMORPHONE HCL 1 MG/ML IJ SOLN
1.0000 mg | Freq: Once | INTRAMUSCULAR | Status: AC
  Administered 2021-04-10: 1 mg via INTRAMUSCULAR
  Filled 2021-04-10: qty 1

## 2021-04-10 MED ORDER — PREDNISONE 20 MG PO TABS
60.0000 mg | ORAL_TABLET | Freq: Once | ORAL | Status: AC
  Administered 2021-04-10: 60 mg via ORAL
  Filled 2021-04-10: qty 3

## 2021-04-10 NOTE — ED Provider Triage Note (Signed)
Emergency Medicine Provider Triage Evaluation Note  JAYCI ELLEFSON , a 66 y.o. female  was evaluated in triage.  Pt complains of increasing back pain.  Patient takes methadone and other medication for chronic pain.  States he gets like this she needs to have an injection..  Review of Systems  Positive: Back pain Negative: Fever, chills, injury  Physical Exam  BP 138/60    Pulse (!) 58    Temp 98.8 F (37.1 C)    Resp 18    SpO2 95%  Gen:   Awake, no distress  Resp:  Normal effort  MSK:   Moves extremities without difficulty  Other:    Medical Decision Making  Medically screening exam initiated at 1:00 PM.  Appropriate orders placed.  RANYAH GROENEVELD was informed that the remainder of the evaluation will be completed by another provider, this initial triage assessment does not replace that evaluation, and the importance of remaining in the ED until their evaluation is complete.     Versie Starks, PA-C 04/10/21 1301

## 2021-04-10 NOTE — ED Provider Notes (Signed)
[facility] Provider Note    Event Date/Time   First MD Initiated Contact with Patient 04/10/21 1605     (approximate)   History   Leg Pain and Back Pain   HPI  Sylvia Cole is a 66 y.o. female with past history of sciatica and multiple prior back surgeries and back injuries who comes ED complaining of acute on chronic left lower back pain radiating down the back of her left leg into her foot.  This feels similar to previous pain episodes that she has had before.  This started after doing increased activity with holiday baking projects.  She denies any significant new falls or injuries.  No paresthesias, no bowel or bladder retention or incontinence or other new acute symptoms.  No fever  She reports that normally when she has pain like this, she receives a dose of parenteral pain medication after which she is able to resume taking her home Flexeril and 5 mg methadone tablets for control of her chronic pain symptoms.     Physical Exam   Triage Vital Signs: ED Triage Vitals  Enc Vitals Group     BP 04/10/21 1252 138/60     Pulse Rate 04/10/21 1252 (!) 58     Resp 04/10/21 1252 18     Temp 04/10/21 1252 98.8 F (37.1 C)     Temp src --      SpO2 04/10/21 1252 95 %     Weight --      Height --      Head Circumference --      Peak Flow --      Pain Score 04/10/21 1251 8     Pain Loc --      Pain Edu? --      Excl. in [place]? --     Most recent vital signs: Vitals:   04/10/21 1252  BP: 138/60  Pulse: (!) 58  Resp: 18  Temp: 98.8 F (37.1 C)  SpO2: 95%     General: Awake, no distress.  CV:  Good peripheral perfusion.  Normal lower extremity pulses. Resp:  Normal effort.  Abd:  No distention.  Other:  No lower extremity edema or tenderness.  Neuro intact, normal plantarflexion and dorsiflexion.  No midline spinal tenderness.  There is tenderness over the left lower back paraspinous musculature.  Pain is also reproduced with passive leg  raise of the left leg.   ED Results / Procedures / Treatments   Labs (all labs ordered are listed, but only abnormal results are displayed) Labs Reviewed - No data to display   EKG     RADIOLOGY     PROCEDURES:  Critical Care performed: No  Procedures   MEDICATIONS ORDERED IN ED: Medications  HYDROmorphone (DILAUDID) injection 1 mg (has no administration in time range)  predniSONE (DELTASONE) tablet 60 mg (has no administration in time range)     IMPRESSION / MDM / ASSESSMENT AND PLAN / ED COURSE  I reviewed the triage vital signs and the nursing notes.                                   Patient presents with left low back pain consistent with sciatica, acute exacerbation of her chronic pain syndrome.  Doubt cauda equina or central cord syndrome, no suspicion for spinal fracture.  No evidence of any infectious process.  Normal vital signs.  Doubt DVT or arterial occlusion.  I think it is reasonable to give the patient a 1 mg IM dose of Dilaudid for acute pain control, start her on a prednisone taper.  She is already established with neurosurgery for monitoring of the symptoms and sees Dr. Lovie Macadamia primary care.      FINAL CLINICAL IMPRESSION(S) / ED DIAGNOSES   Final diagnoses:  Sciatica of left side     Rx / DC Orders   ED Discharge Orders          Ordered    predniSONE (STERAPRED UNI-PAK 21 TAB) 10 MG (21) TBPK tablet        04/10/21 1639             Note:  This document was prepared using Dragon voice recognition software and may include unintentional dictation errors.   Carrie Mew, MD 04/10/21 [phone]

## 2021-04-10 NOTE — ED Notes (Signed)
RN to bedside. Pt CAO and in no acute distress.

## 2021-04-10 NOTE — ED Triage Notes (Signed)
Pt here with reports of lower L back and L leg pain onset several weeks ago. Pt has used heating pad for minimal relief. Pt reports having falls after the pain started due to sharp pain shooting down her leg.

## 2021-06-24 ENCOUNTER — Other Ambulatory Visit: Payer: Self-pay

## 2021-06-24 ENCOUNTER — Ambulatory Visit
Admission: RE | Admit: 2021-06-24 | Discharge: 2021-06-24 | Disposition: A | Discharge status: Home / Self Care | Payer: Medicare Other | Source: Ambulatory Visit | Attending: Obstetrics & Gynecology | Admitting: Obstetrics & Gynecology

## 2021-06-24 DIAGNOSIS — Z1231 Encounter for screening mammogram for malignant neoplasm of breast: Secondary | ICD-10-CM

## 2021-06-24 DIAGNOSIS — Z803 Family history of malignant neoplasm of breast: Secondary | ICD-10-CM | POA: Diagnosis present

## 2021-11-29 ENCOUNTER — Encounter: Payer: Self-pay | Admitting: Emergency Medicine

## 2021-11-29 ENCOUNTER — Other Ambulatory Visit: Payer: Self-pay

## 2021-11-29 ENCOUNTER — Emergency Department
Admission: EM | Admit: 2021-11-29 | Discharge: 2021-11-29 | Disposition: A | Discharge status: Home / Self Care | Payer: Medicare Other | Attending: Emergency Medicine | Admitting: Emergency Medicine

## 2021-11-29 DIAGNOSIS — M5442 Lumbago with sciatica, left side: Secondary | ICD-10-CM | POA: Insufficient documentation

## 2021-11-29 DIAGNOSIS — M5432 Sciatica, left side: Secondary | ICD-10-CM

## 2021-11-29 DIAGNOSIS — M545 Low back pain, unspecified: Secondary | ICD-10-CM | POA: Diagnosis present

## 2021-11-29 MED ORDER — KETOROLAC TROMETHAMINE 30 MG/ML IJ SOLN
30.0000 mg | Freq: Once | INTRAMUSCULAR | Status: AC
  Administered 2021-11-29: 30 mg via INTRAMUSCULAR
  Filled 2021-11-29: qty 1

## 2021-11-29 MED ORDER — PREDNISONE 10 MG (21) PO TBPK: ORAL_TABLET | ORAL | 0 refills | Status: AC

## 2021-11-29 NOTE — ED Provider Notes (Signed)
[facility] Provider Note    Event Date/Time   First MD Initiated Contact with Patient 11/29/21 1617     (approximate)   History   Chief Complaint Back Pain   HPI Sylvia Cole is a 66 y.o. female, history of anxiety, arthritis, chronic back pain, presents emergency department for evaluation of left-sided back pain with radiation to the left lower extremity.  Patient states she has a history of sciatica and believes her symptoms are consistent with her previous flares.  She is currently already taking methadone and cyclobenzaprine, with minimal relief.  Denies any recent injuries or illnesses.  Denies fever/chills, chest pain, shortness of breath, abdominal pain, flank pain, nausea/vomiting, diarrhea, bowel/bladder dysfunction, dizziness/lightheadedness, or rash/lesions.  History Limitations: No limitations.        Physical Exam  Triage Vital Signs: ED Triage Vitals  Enc Vitals Group     BP 11/29/21 1418 129/77     Pulse Rate 11/29/21 1418 62     Resp 11/29/21 1418 18     Temp 11/29/21 1418 98.4 F (36.9 C)     Temp Source 11/29/21 1418 Oral     SpO2 11/29/21 1418 96 %     Weight 11/29/21 1302 145 lb 1 oz (65.8 kg)     Height 11/29/21 1302 '4\' 10"'$  (1.473 m)     Head Circumference --      Peak Flow --      Pain Score 11/29/21 1302 10     Pain Loc --      Pain Edu? --      Excl. in [place]? --     Most recent vital signs: Vitals:   11/29/21 1418  BP: 129/77  Pulse: 62  Resp: 18  Temp: 98.4 F (36.9 C)  SpO2: 96%    General: Awake, NAD.  Skin: Warm, dry. No rashes or lesions.  Eyes: PERRL. Conjunctivae normal.  CV: Good peripheral perfusion.  Resp: Normal effort.  Abd: Soft, non-tender. No distention.  Neuro: At baseline. No gross neurological deficits.  Musculoskeletal: Normal ROM of all extremities.   Focused Exam: No midline lumbar spinal tenderness.  Positive straight leg test in the left lower extremity.  Physical Exam    ED  Results / Procedures / Treatments  Labs (all labs ordered are listed, but only abnormal results are displayed) Labs Reviewed - No data to display   EKG N/A.   RADIOLOGY  ED Provider Interpretation: N/A.  No results found.  PROCEDURES:  Critical Care performed: N/A.  Procedures    MEDICATIONS ORDERED IN ED: Medications  ketorolac (TORADOL) 30 MG/ML injection 30 mg (30 mg Intramuscular Given 11/29/21 1657)     IMPRESSION / MDM / ASSESSMENT AND PLAN / ED COURSE  I reviewed the triage vital signs and the nursing notes.                              Differential diagnosis includes, but is not limited to, sciatica, disc herniation, lumbar radiculopathy, lumbosacral strain  ED Course Patient appears well, vitals within normal limits.  Currently endorsing mild-moderate pain.  Will go ahead treat with IM ketorolac.  Assessment/Plan Presentation consistent with sciatica.  She is not exhibiting any red flag symptoms.  Treated her here in the emergency department with IM ketorolac.  She is currently already on methadone and cyclobenzaprine for treatment.  She states that she has responded well to prednisone tapers in the  past.  We will provide her with a prescription for this.  Additionally encouraged her to take acetaminophen/ibuprofen as well when needed.  We will additionally provide her with educational material.  Encouraged her to follow-up with her orthopedist.  Will discharge.  Provided the patient with anticipatory guidance, return precautions, and educational material. Encouraged the patient to return to the emergency department at any time if they begin to experience any new or worsening symptoms. Patient expressed understanding and agreed with the plan.   Patient's presentation is most consistent with acute, uncomplicated illness.       FINAL CLINICAL IMPRESSION(S) / ED DIAGNOSES   Final diagnoses:  Sciatica of left side     Rx / DC Orders   ED Discharge Orders           Ordered    predniSONE (STERAPRED UNI-PAK 21 TAB) 10 MG (21) TBPK tablet  Daily        11/29/21 1624             Note:  This document was prepared using Dragon voice recognition software and may include unintentional dictation errors.   Teodoro Spray, [place] 11/29/21 2328    Nena Polio, MD 11/30/21 [phone]

## 2021-11-29 NOTE — Discharge Instructions (Addendum)
-  Take the prednisone as prescribed.  Recommend taking it with food daily.  -You may additionally take acetaminophen (500 mg) and ibuprofen (400 mg) together every 6 hours as needed if your symptoms still need to be controlled.  -Follow-up with the orthopedist, as discussed.  -Return to the emergency department anytime if you begin to experience any new or worsening symptoms.

## 2021-11-29 NOTE — ED Triage Notes (Signed)
Left side back pain radiates to left leg- history of sciatica.

## 2021-11-29 NOTE — ED Triage Notes (Signed)
C/O Left lower back pain radiating down left leg.  Patient states pain has been ongoing x 3.5 years.

## 2022-03-29 IMAGING — MG MM DIGITAL SCREENING BILAT W/ TOMO AND CAD
8 series · 8 of 24 positions shown · non-contrast
Comparison: Previous exam(s).

CLINICAL DATA: Screening.

EXAM:
DIGITAL SCREENING BILATERAL MAMMOGRAM WITH TOMOSYNTHESIS AND CAD
TECHNIQUE: Bilateral screening digital craniocaudal and mediolateral oblique
mammograms were obtained. Bilateral screening digital breast
tomosynthesis was performed. The images were evaluated with
computer-aided detection.

[R MLO synth-2D]
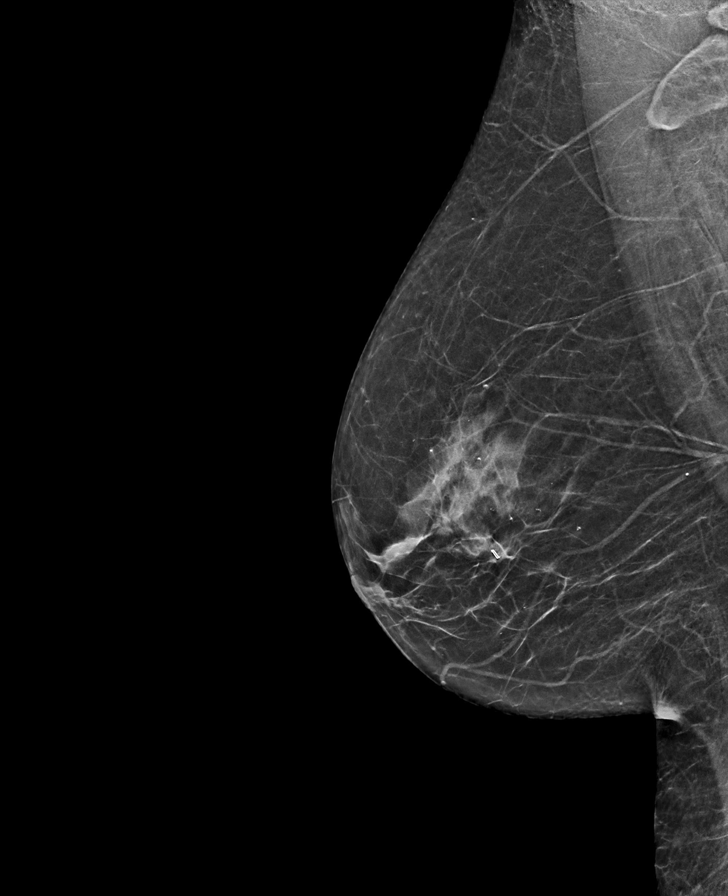

[L MLO synth-2D]
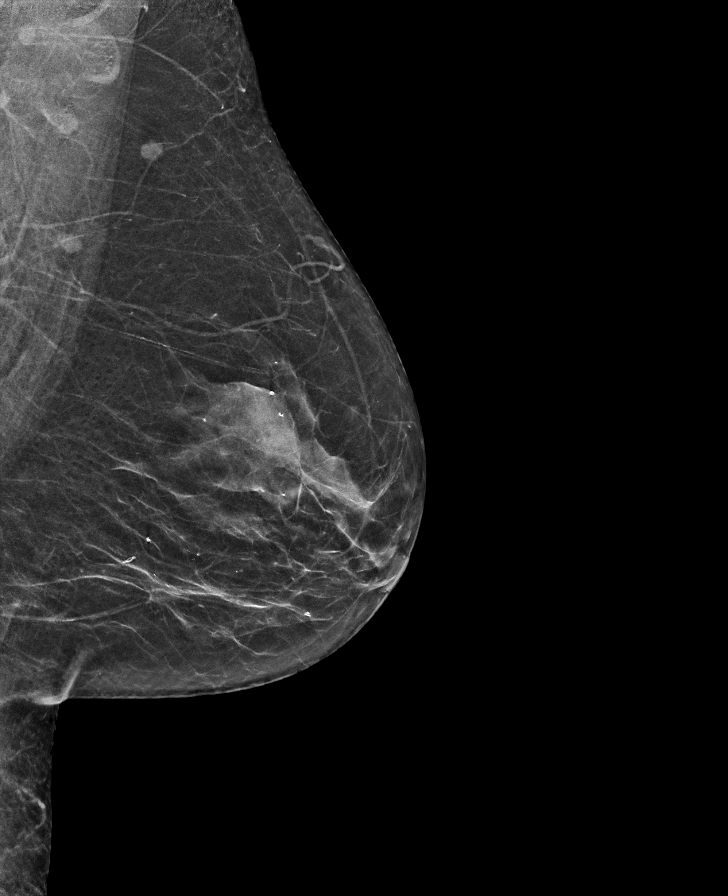

[L CC synth-2D]
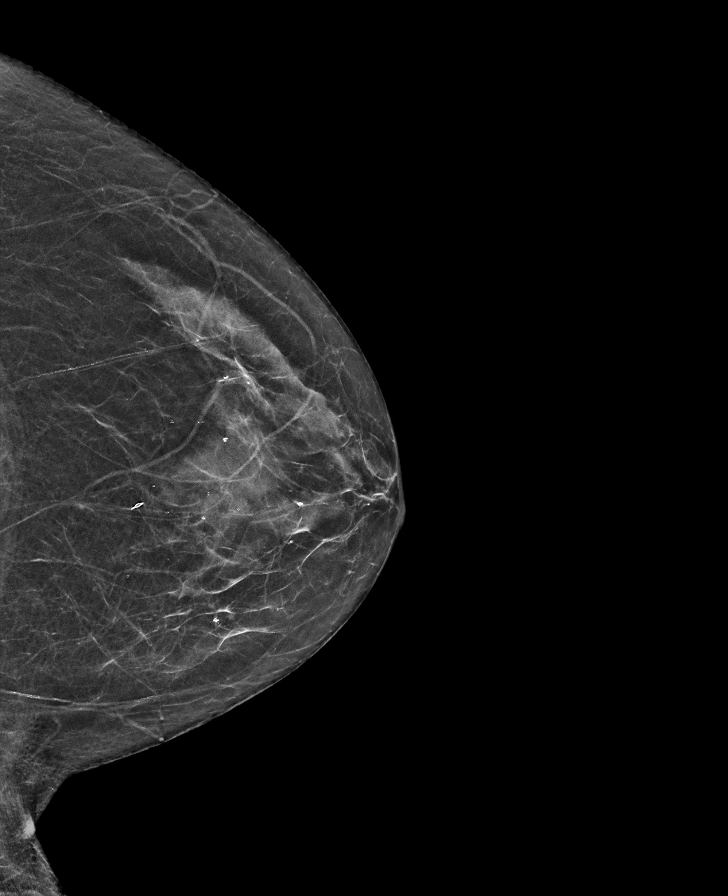

[R CC synth-2D]
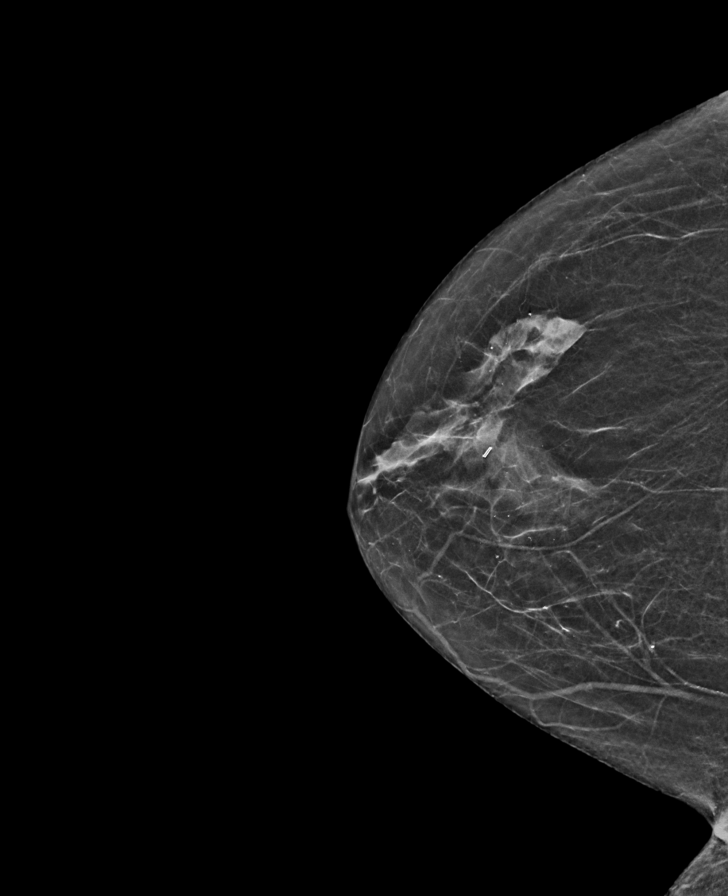

[R MLO tomo · tomo slice 29/57.0]
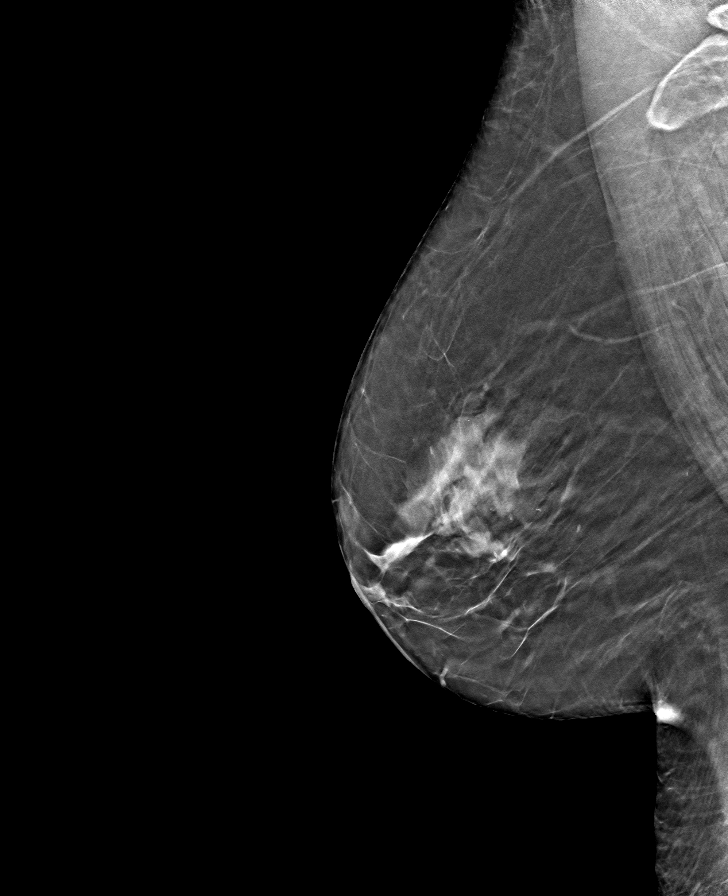

[R CC tomo · tomo slice 25/48.0]
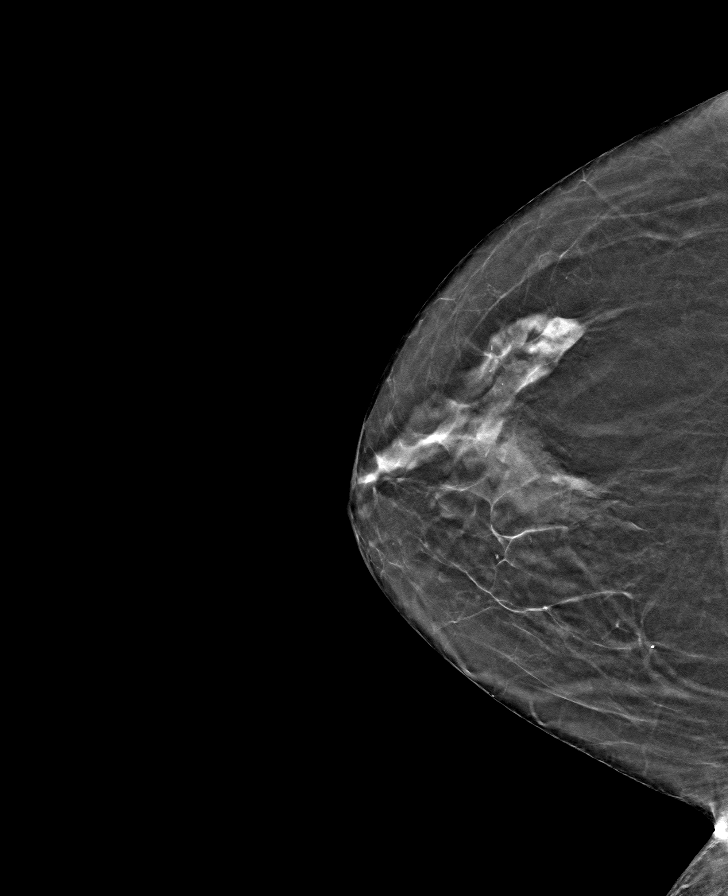

[L MLO tomo · tomo slice 29/57.0]
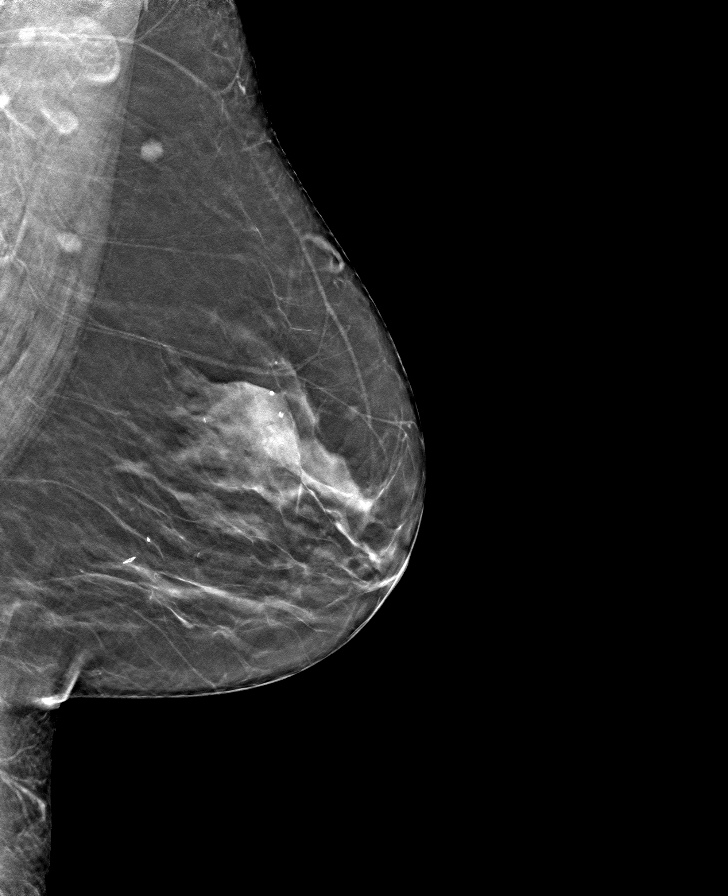

[L CC tomo · tomo slice 27/53.0]
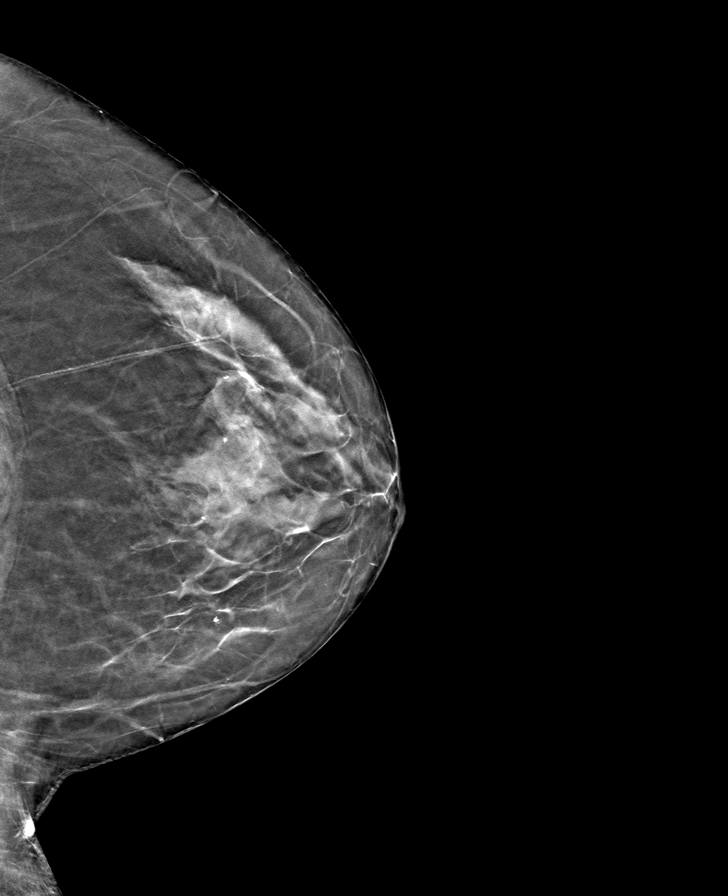

[8 of 24 positions shown; findings below may reference images not displayed]

ACR Breast Density Category b: There are scattered areas of
fibroglandular density.
FINDINGS: There are no findings suspicious for malignancy.
IMPRESSION: No mammographic evidence of malignancy. A result letter of this
screening mammogram will be mailed directly to the patient.

RECOMMENDATION:
Screening mammogram in one year. (Code:51-O-LD2)

BI-RADS CATEGORY  1: Negative.

## 2022-05-31 ENCOUNTER — Other Ambulatory Visit: Payer: Self-pay | Admitting: Family Medicine

## 2022-05-31 DIAGNOSIS — Z1231 Encounter for screening mammogram for malignant neoplasm of breast: Secondary | ICD-10-CM

## 2022-06-27 ENCOUNTER — Ambulatory Visit
Admission: RE | Admit: 2022-06-27 | Discharge: 2022-06-27 | Disposition: A | Discharge status: Home / Self Care | Payer: Medicare Other | Source: Ambulatory Visit | Attending: Family Medicine | Admitting: Family Medicine

## 2022-06-27 DIAGNOSIS — Z1231 Encounter for screening mammogram for malignant neoplasm of breast: Secondary | ICD-10-CM | POA: Insufficient documentation

## 2022-07-15 ENCOUNTER — Encounter: Payer: Self-pay | Admitting: Intensive Care

## 2022-07-15 ENCOUNTER — Emergency Department
Admission: EM | Admit: 2022-07-15 | Discharge: 2022-07-15 | Disposition: A | Discharge status: Home / Self Care | Payer: Medicare Other | Attending: Emergency Medicine | Admitting: Emergency Medicine

## 2022-07-15 ENCOUNTER — Other Ambulatory Visit: Payer: Self-pay

## 2022-07-15 DIAGNOSIS — M545 Low back pain, unspecified: Secondary | ICD-10-CM | POA: Diagnosis present

## 2022-07-15 HISTORY — DX: Essential (primary) hypertension: I10

## 2022-07-15 MED ORDER — PREDNISONE 50 MG PO TABS: ORAL_TABLET | ORAL | 0 refills | Status: DC

## 2022-07-15 MED ORDER — DIAZEPAM 5 MG PO TABS
5.0000 mg | ORAL_TABLET | Freq: Once | ORAL | Status: AC
  Administered 2022-07-15: 5 mg via ORAL
  Filled 2022-07-15: qty 1

## 2022-07-15 MED ORDER — ACETAMINOPHEN 500 MG PO TABS
1000.0000 mg | ORAL_TABLET | Freq: Once | ORAL | Status: AC
Start: 2022-07-15 — End: 2022-07-15
  Administered 2022-07-15: 1000 mg via ORAL
  Filled 2022-07-15: qty 2

## 2022-07-15 MED ORDER — KETOROLAC TROMETHAMINE 30 MG/ML IJ SOLN
30.0000 mg | Freq: Once | INTRAMUSCULAR | Status: AC
  Administered 2022-07-15: 30 mg via INTRAMUSCULAR
  Filled 2022-07-15: qty 1

## 2022-07-15 MED ORDER — PREDNISONE 20 MG PO TABS
60.0000 mg | ORAL_TABLET | Freq: Once | ORAL | Status: AC
  Administered 2022-07-15: 60 mg via ORAL
  Filled 2022-07-15: qty 3

## 2022-07-15 NOTE — Discharge Instructions (Signed)
Continue your home pain meds for back pain and call your primary doctor and Dr Myer Haff for a follow up this week.   Thank you for choosing [place] for your health care today!  Please see your primary doctor this week for a follow up appointment.   Sometimes, in the early stages of certain disease courses it is difficult to detect in the emergency department evaluation -- so, it is important that you continue to monitor your symptoms and call your doctor right away or return to the emergency department if you develop any new or worsening symptoms.  Please go to the following website to schedule new (and existing) patient appointments:   [email or web address]  If you do not have a primary doctor try calling the following clinics to establish care:  If you have insurance:  [facility] [phone] [address] [place] [place] [address]   [facility]  [phone] [address] [place] [place] [address]   If you do not have insurance:  [facility]  [phone] [address] [place] [address]   The following is another list of primary care offices in the area who are accepting new patients at this time.  Please reach out to one of them directly and let them know you would like to schedule an appointment to follow up on an Emergency Department visit, and/or to establish a new primary care provider (PCP).  There are likely other primary care clinics in the are who are accepting new patients, but this is an excellent place to start:  [facility] Lead physician: Dr Shirlee Latch [address] [place], [place] [address] [phone]  [facility] Lead Physician: Dr Alba Cory [address], [place], [place] [address] [phone]  [facility]  Lead Physician: Dr Olevia Perches [address] [place], [place] [address] [phone]  [facility] Lead Physician: Dr Sofie Hartigan [address] [place], [place], [place] [address] [phone]  [facility] at [facility] Lead Physician: Dr Bari Edward [address] [place], [place] [address] [phone]   It was my pleasure to care for you today.   Daneil Dan Modesto Charon, MD

## 2022-07-15 NOTE — ED Triage Notes (Signed)
Presents to ER with lower back pain. Reports flare up. History chronic back pain.

## 2022-07-15 NOTE — ED Provider Notes (Signed)
[facility] Provider Note    Event Date/Time   First MD Initiated Contact with Patient 07/15/22 1059     (approximate)   History   Back Pain   HPI  Sylvia Cole is a 67 y.o. female   Past medical history of phonic lower back pain from a previous injury status post back surgeries who presents to the emergency room department with acute exacerbation of chronic back pain.  She states that she predictably has worsening back pain with the change of weather in the springtime.  This is been gradually worsening over the last 3 weeks.  No new injuries.  She denies any motor or sensory deficits, incontinence, fevers or any changes in her normal expected exacerbations of chronic back pain.  She takes methadone as well as Flexeril at baseline.  Denies any urinary symptoms, denies any traumatic injuries recently.  Independent Historian contributed to assessment above: Her and is at bedside who corroborates past medical history obtained as above  External Medical Documents Reviewed: Physical medicine and rehabilitation note from July 2023 addressing her lower back pain at that time received an injection, noting past medical history of post lumbar spine surgery x 3 last surgery at that time was noted in 1998 with longstanding lower back pain with radiation into the right lower extremity      Physical Exam   Triage Vital Signs: ED Triage Vitals  Enc Vitals Group     BP 07/15/22 0951 (!) 139/90     Pulse Rate 07/15/22 0949 65     Resp 07/15/22 0949 18     Temp 07/15/22 0949 98.1 F (36.7 C)     Temp Source 07/15/22 0949 Oral     SpO2 07/15/22 0949 95 %     Weight 07/15/22 0951 135 lb (61.2 kg)     Height 07/15/22 0951 4\' 10"  (1.473 m)     Head Circumference --      Peak Flow --      Pain Score 07/15/22 0951 10     Pain Loc --      Pain Edu? --      Excl. in GC? --     Most recent vital signs: Vitals:   07/15/22 0949 07/15/22 0951  BP:  (!) 139/90   Pulse: 65   Resp: 18   Temp: 98.1 F (36.7 C)   SpO2: 95%     General: Awake, no distress.  CV:  Good peripheral perfusion.  Resp:  Normal effort.  Abd:  No distention.  Other:  Left greater than right paraspinal tenderness to palpation of lumbar area.  Motor or sensory exam to bilateral lower extremities intact ambulatory with steady gait.   ED Results / Procedures / Treatments   Labs (all labs ordered are listed, but only abnormal results are displayed) Labs Reviewed - No data to display     PROCEDURES:  Critical Care performed: No  Procedures   MEDICATIONS ORDERED IN ED: Medications  ketorolac (TORADOL) 30 MG/ML injection 30 mg (30 mg Intramuscular Given 07/15/22 1120)  acetaminophen (TYLENOL) tablet 1,000 mg (1,000 mg Oral Given 07/15/22 1119)  diazepam (VALIUM) tablet 5 mg (5 mg Oral Given 07/15/22 1119)  predniSONE (DELTASONE) tablet 60 mg (60 mg Oral Given 07/15/22 1119)    IMPRESSION / MDM / ASSESSMENT AND PLAN / ED COURSE  I reviewed the triage vital signs and the nursing notes.  Patient's presentation is most consistent with severe exacerbation of chronic illness.  Differential diagnosis includes, but is not limited to, msk strain, radiculopathy, considered but less likely fracture/dislocation or spinal cord pathology    MDM: Well-appearing patient with no red flag symptoms of cord pathology, no trauma to suggest acute fracture or dislocation, and this is a acute exacerbation of chronic back pain which the patient experiences during this time of year with weather changes.  She is asking for some pain medication in the emergency department which I we will give, she states that Toradol has worked in the past as well as muscle relaxant and prednisone.  I prescribed her prednisone burst, gave IM Toradol, and 1 of Valium and she is having a friend drive her home.  She will follow-up with Dr. Marcell Barlow who is already established care  with her as well as her PMD and return to the emergency department with any new or worsening symptoms.         FINAL CLINICAL IMPRESSION(S) / ED DIAGNOSES   Final diagnoses:  Acute bilateral low back pain without sciatica     Rx / DC Orders   ED Discharge Orders          Ordered    predniSONE (DELTASONE) 50 MG tablet  Status:  Discontinued        07/15/22 1115    predniSONE (DELTASONE) 50 MG tablet        07/15/22 1239             Note:  This document was prepared using Dragon voice recognition software and may include unintentional dictation errors.    Pilar Jarvis, MD 07/15/22 1240

## 2022-07-28 ENCOUNTER — Emergency Department
Admission: EM | Admit: 2022-07-28 | Discharge: 2022-07-28 | Disposition: A | Discharge status: Home / Self Care | Payer: Medicare Other | Attending: Emergency Medicine | Admitting: Emergency Medicine

## 2022-07-28 ENCOUNTER — Encounter: Payer: Self-pay | Admitting: Intensive Care

## 2022-07-28 ENCOUNTER — Other Ambulatory Visit: Payer: Self-pay

## 2022-07-28 DIAGNOSIS — M545 Low back pain, unspecified: Secondary | ICD-10-CM | POA: Insufficient documentation

## 2022-07-28 DIAGNOSIS — G8929 Other chronic pain: Secondary | ICD-10-CM | POA: Insufficient documentation

## 2022-07-28 MED ORDER — HYDROMORPHONE HCL 1 MG/ML IJ SOLN
1.0000 mg | Freq: Once | INTRAMUSCULAR | Status: AC
  Administered 2022-07-28: 1 mg via INTRAMUSCULAR
  Filled 2022-07-28: qty 1

## 2022-07-28 MED ORDER — PREDNISONE 10 MG (48) PO TBPK: ORAL_TABLET | ORAL | 0 refills | Status: AC

## 2022-07-28 MED ORDER — KETOROLAC TROMETHAMINE 30 MG/ML IJ SOLN
30.0000 mg | Freq: Once | INTRAMUSCULAR | Status: AC
  Administered 2022-07-28: 30 mg via INTRAMUSCULAR
  Filled 2022-07-28: qty 1

## 2022-07-28 NOTE — ED Triage Notes (Signed)
Patient c/o lower back pain that radiates down left leg. Chronic history of same. Seen for same recently in ER  Patient takes methadone and flexeril

## 2022-07-28 NOTE — ED Provider Notes (Signed)
[facility] Provider Note    Event Date/Time   First MD Initiated Contact with Patient 07/28/22 1254     (approximate)   History   Back Pain   HPI  Sylvia Cole is a 67 y.o. female with history of chronic back pain presents emergency department stating she is this had a flare of her sciatica.  States whenever they gave her last time did not work.  States she usually gets a pain shot, then will go on a steroid taper instead of a burst.  Has not followed up with her regular doctor.  Is still taking her methadone and Flexeril.      Physical Exam   Triage Vital Signs: ED Triage Vitals  Enc Vitals Group     BP 07/28/22 1111 123/83     Pulse Rate 07/28/22 1111 65     Resp 07/28/22 1111 16     Temp 07/28/22 1111 97.8 F (36.6 C)     Temp Source 07/28/22 1111 Oral     SpO2 07/28/22 1111 93 %     Weight 07/28/22 1113 140 lb (63.5 kg)     Height 07/28/22 1113  (1.473 m)     Head Circumference --      Peak Flow --      Pain Score 07/28/22 1112 10     Pain Loc --      Pain Edu? --      Excl. in GC? --     Most recent vital signs: Vitals:   07/28/22 1111  BP: 123/83  Pulse: 65  Resp: 16  Temp: 97.8 F (36.6 C)  SpO2: 93%     General: Awake, no distress.   CV:  Good peripheral perfusion. regular rate and  rhythm Resp:  Normal effort.  Abd:  No distention.   Other:  Lumbar spine mildly tender, 5 or 5 strength lower extremities   ED Results / Procedures / Treatments   Labs (all labs ordered are listed, but only abnormal results are displayed) Labs Reviewed - No data to display   EKG     RADIOLOGY     PROCEDURES:   Procedures   MEDICATIONS ORDERED IN ED: Medications  HYDROmorphone (DILAUDID) injection 1 mg (has no administration in time range)  ketorolac (TORADOL) 30 MG/ML injection 30 mg (has no administration in time range)     IMPRESSION / MDM / ASSESSMENT AND PLAN / ED COURSE  I reviewed the triage vital  signs and the nursing notes.                              Differential diagnosis includes, but is not limited to, exacerbation of chronic pain, sciatica, fracture  Patient's presentation is most consistent with exacerbation of chronic illness.   Patient was given diladudid  I'm, toradol  IM  She was given instructions to follow-up with her regular doctor.  Return emergency department worsening.  Given a prescription for Sterapred 12-day Dosepak.  Return emergency department worsening patient is in agreement treatment plan.  Discharged in good condition.      FINAL CLINICAL IMPRESSION(S) / ED DIAGNOSES   Final diagnoses:  Chronic low back pain with sciatica, sciatica laterality unspecified, unspecified back pain laterality     Rx / DC Orders   ED Discharge Orders          Ordered    predniSONE (STERAPRED UNI-PAK 48 TAB) 10  MG (48) TBPK tablet        07/28/22 1347             Note:  This document was prepared using Dragon voice recognition software and may include unintentional dictation errors.    Faythe Ghee, PA-C 07/28/22 1414    Sharyn Creamer, MD 07/28/22 1540

## 2023-06-16 ENCOUNTER — Other Ambulatory Visit: Payer: Self-pay | Admitting: Family Medicine

## 2023-06-16 DIAGNOSIS — Z1231 Encounter for screening mammogram for malignant neoplasm of breast: Secondary | ICD-10-CM

## 2023-06-29 ENCOUNTER — Ambulatory Visit
Admission: RE | Admit: 2023-06-29 | Discharge: 2023-06-29 | Disposition: A | Discharge status: Home / Self Care | Source: Ambulatory Visit | Attending: Family Medicine | Admitting: Family Medicine

## 2023-06-29 DIAGNOSIS — Z1231 Encounter for screening mammogram for malignant neoplasm of breast: Secondary | ICD-10-CM | POA: Diagnosis present

## 2023-12-17 ENCOUNTER — Other Ambulatory Visit: Payer: Self-pay

## 2023-12-17 ENCOUNTER — Emergency Department

## 2023-12-17 ENCOUNTER — Encounter: Payer: Self-pay | Admitting: Emergency Medicine

## 2023-12-17 DIAGNOSIS — S52502A Unspecified fracture of the lower end of left radius, initial encounter for closed fracture: Secondary | ICD-10-CM | POA: Insufficient documentation

## 2023-12-17 DIAGNOSIS — W19XXXA Unspecified fall, initial encounter: Secondary | ICD-10-CM | POA: Insufficient documentation

## 2023-12-17 DIAGNOSIS — Y92000 Kitchen of unspecified non-institutional (private) residence as  the place of occurrence of the external cause: Secondary | ICD-10-CM | POA: Diagnosis not present

## 2023-12-17 DIAGNOSIS — S6992XA Unspecified injury of left wrist, hand and finger(s), initial encounter: Secondary | ICD-10-CM | POA: Diagnosis present

## 2023-12-17 NOTE — ED Triage Notes (Addendum)
 Patient reports mechanical fall, falling on her left hand.  Patient reports left wrist and hand pain. Patient denies hitting head, denies LOC.

## 2023-12-18 ENCOUNTER — Emergency Department
Admission: EM | Admit: 2023-12-18 | Discharge: 2023-12-18 | Disposition: A | Discharge status: Home / Self Care | Attending: Emergency Medicine | Admitting: Emergency Medicine

## 2023-12-18 DIAGNOSIS — S52502A Unspecified fracture of the lower end of left radius, initial encounter for closed fracture: Secondary | ICD-10-CM

## 2023-12-18 NOTE — Discharge Instructions (Signed)
Please read through the included information about splint care (keep it clean and dry).  If your pain becomes much more severe, the splint becomes too tight, or you feel as if your injured limb is becoming numb or cold, please return immediately to the Emergency Department.  Follow up with the orthopedics specialist listed in this paperwork.  When possible, keep your splint elevated to help with the swelling.  You may also use ice packs over the splint.

## 2023-12-18 NOTE — ED Provider Notes (Signed)
 [facility] Provider Note    Event Date/Time   First MD Initiated Contact with Patient 12/18/23 0139     (approximate)   History   Wrist Pain   HPI Sylvia Cole is a 68 y.o. female who presents for evaluation of left wrist pain after a fall.  She said that sometimes her right leg locks up on her and that happened in the kitchen.  When it happened she fell and caught herself on her outstretched nondominant left wrist.  She has pain with movement of the wrist and a little bit of swelling but she is able to move her fingers and has no numbness or tingling.  No other injuries were sustained.     Physical Exam   Triage Vital Signs: ED Triage Vitals  Encounter Vitals Group     BP 12/17/23 2200 139/83     Girls Systolic BP Percentile --      Girls Diastolic BP Percentile --      Boys Systolic BP Percentile --      Boys Diastolic BP Percentile --      Pulse Rate 12/17/23 2200 78     Resp 12/17/23 2200 18     Temp 12/17/23 2200 97.8 F (36.6 C)     Temp Source 12/17/23 2200 Oral     SpO2 12/17/23 2200 98 %     Weight 12/17/23 2201 65.8 kg (145 lb)     Height --      Head Circumference --      Peak Flow --      Pain Score 12/17/23 2200 9     Pain Loc --      Pain Education --      Exclude from Growth Chart --     Most recent vital signs: Vitals:   12/17/23 2200 12/18/23 0142  BP: 139/83 (!) 143/80  Pulse: 78 71  Resp: 18 17  Temp: 97.8 F (36.6 C) 97.7 F (36.5 C)  SpO2: 98% 97%    General: Awake, no distress.  CV:  Good peripheral perfusion.  Easily palpable left radial pulse. Resp:  Normal effort. Speaking easily and comfortably, no accessory muscle usage nor intercostal retractions.   Abd:  No distention.  Other:  Mild swelling and tenderness to palpation of the left wrist.  Normal range of motion of the fingers.   ED Results / Procedures / Treatments   Labs (all labs ordered are listed, but only abnormal results are  displayed) Labs Reviewed - No data to display    RADIOLOGY I independently viewed and interpreted the patient's left wrist and left hand x-rays and she has a impacted fracture of the left wrist.  Confirmed by radiologist.   PROCEDURES:  Critical Care performed: No  .Ortho Injury Treatment  Date/Time: 12/18/2023 2:26 AM  Performed by: Gordan Huxley, MD Authorized by: Gordan Huxley, MD   Consent:    Consent obtained:  Verbal   Consent given by:  Patient   Risks discussed:  Fracture, restricted joint movement and stiffness   Alternatives discussed:  No treatmentInjury location: wrist Location details: left wrist Injury type: fracture Fracture type: distal radius Pre-procedure neurovascular assessment: neurovascularly intact Pre-procedure distal perfusion: normal Pre-procedure neurological function: normal Pre-procedure range of motion: reduced Manipulation performed: no Immobilization: splint Splint type: sugar tong Splint Applied by: ED Tech Supplies used: Ortho-Glass Post-procedure neurovascular assessment: post-procedure neurovascularly intact Post-procedure distal perfusion: normal Post-procedure neurological function: normal Post-procedure range of motion: unchanged  IMPRESSION / MDM / ASSESSMENT AND PLAN / ED COURSE  I reviewed the triage vital signs and the nursing notes.                              Differential diagnosis includes, but is not limited to, wrist sprain, wrist fracture, dislocation.  Patient's presentation is most consistent with acute presentation with potential threat to life or bodily function.  Labs/studies ordered: Left wrist and hand x-rays  Interventions/Medications given:  Medications - No data to display  (Note:  hospital course my include additional interventions and/or labs/studies not listed above.)   Distal radius impaction fracture, no significant displacement or angulation.  Pain generally well-controlled.  Treated  with splint, recommended ibuprofen , Tylenol , cold packs, orthopedics follow-up.  Patient understands and agrees with the plan.        FINAL CLINICAL IMPRESSION(S) / ED DIAGNOSES   Final diagnoses:  Closed fracture of distal end of left radius, unspecified fracture morphology, initial encounter     Rx / DC Orders   ED Discharge Orders     None        Note:  This document was prepared using Dragon voice recognition software and may include unintentional dictation errors.   Gordan Huxley, MD 12/18/23 [phone]
